# Patient Record
Sex: Female | Born: 1990 | Race: Black or African American | Hispanic: No | Marital: Single | Smoking: Never smoker
Health system: Southern US, Community
[De-identification: ages and names within clinical notes are randomized; demographics above are authoritative.]

## PROBLEM LIST (undated history)

## (undated) ENCOUNTER — Inpatient Hospital Stay (HOSPITAL_COMMUNITY): Payer: Self-pay

## (undated) DIAGNOSIS — Z789 Other specified health status: Secondary | ICD-10-CM

## (undated) DIAGNOSIS — F419 Anxiety disorder, unspecified: Secondary | ICD-10-CM

## (undated) DIAGNOSIS — F32A Depression, unspecified: Secondary | ICD-10-CM

## (undated) DIAGNOSIS — D649 Anemia, unspecified: Secondary | ICD-10-CM

## (undated) DIAGNOSIS — F329 Major depressive disorder, single episode, unspecified: Secondary | ICD-10-CM

## (undated) HISTORY — PX: THERAPEUTIC ABORTION: SHX798

## (undated) HISTORY — PX: DILATION AND CURETTAGE OF UTERUS: SHX78

---

## 2011-06-25 ENCOUNTER — Emergency Department (HOSPITAL_COMMUNITY): Payer: BC Managed Care – PPO

## 2011-06-25 ENCOUNTER — Emergency Department (HOSPITAL_COMMUNITY)
Admission: EM | Admit: 2011-06-25 | Discharge: 2011-06-25 | Disposition: A | Discharge status: Home Health | Payer: BC Managed Care – PPO | Attending: Emergency Medicine | Admitting: Emergency Medicine

## 2011-06-25 ENCOUNTER — Encounter (HOSPITAL_COMMUNITY): Payer: Self-pay | Admitting: Family Medicine

## 2011-06-25 DIAGNOSIS — N898 Other specified noninflammatory disorders of vagina: Secondary | ICD-10-CM | POA: Insufficient documentation

## 2011-06-25 DIAGNOSIS — O200 Threatened abortion: Secondary | ICD-10-CM | POA: Insufficient documentation

## 2011-06-25 LAB — CBC
Hemoglobin: 11.6 g/dL — ABNORMAL LOW (ref 12.0–15.0)
MCH: 21.4 pg — ABNORMAL LOW (ref 26.0–34.0)
MCHC: 32.6 g/dL (ref 30.0–36.0)
RDW: 15.2 % (ref 11.5–15.5)

## 2011-06-25 LAB — DIFFERENTIAL
Basophils Absolute: 0 10*3/uL (ref 0.0–0.1)
Basophils Relative: 0 % (ref 0–1)
Eosinophils Absolute: 0.3 10*3/uL (ref 0.0–0.7)
Lymphocytes Relative: 14 % (ref 12–46)
Monocytes Absolute: 0.7 10*3/uL (ref 0.1–1.0)
Neutrophils Relative %: 75 % (ref 43–77)

## 2011-06-25 LAB — HCG, QUANTITATIVE, PREGNANCY: hCG, Beta Chain, Quant, S: 50442 m[IU]/mL — ABNORMAL HIGH (ref <5)

## 2011-06-25 NOTE — Discharge Instructions (Signed)
Bleeding during the first 20 weeks of pregnancy is common. This is sometimes called a threatened miscarriage. This is a pregnancy that is threatening to end before the twentieth week of pregnancy.  Miscarriages occur in 15 to 20% of all pregnancies and usually occur during the first 13 weeks of the pregnancy. The exact cause of a miscarriage is usually never known. A miscarriage is nature’s way of ending a pregnancy that is abnormal or would not make it to term.  °HOME CARE INSTRUCTIONS °DO NOT USE TAMPONS. Do not douche, have sexual intercourse or orgasms until approved by your caregiver.  °Call for re-evaluation of your pregnancy and possible repeat blood test. Re-evaluation often occurs after 2 days.  °If you are Rh negative and the father is Rh positive or you do not know the father’s blood type, you may receive a shot (Rh immune globulin) to help prevent abnormal antibodies that can develop and affect the baby in any future pregnancies. °SEEK IMMEDIATE MEDICAL ATTENTION IF: °You have severe cramps in your stomach, back, or abdomen.  °You have a sudden onset of severe pain in the lower part of your abdomen.  °You run an unexplained temperature of 101 F (38.3 C) or higher.  °You pass large clots or tissue. Save any tissue for your caregiver to inspect.  °Your bleeding increases or you become light-headed, weak, or have fainting episodes.  °

## 2011-06-25 NOTE — ED Notes (Signed)
Patient transported to Ultrasound 

## 2011-06-25 NOTE — ED Provider Notes (Signed)
History     CSN: [id number]  Arrival date & time 06/25/11  0846   First MD Initiated Contact with Patient 06/25/11 [phone]      Chief Complaint  Patient presents with  . Vaginal Bleeding    (Consider location/radiation/quality/duration/timing/severity/associated sxs/prior treatment) HPI This 21 year old female reports she is gravida 1 para 0 with a positive pregnancy test just over 4 weeks ago at the end of January 2013. Since that time 2 weeks ago she had some light vaginal spotting for less than one day. Then last night and this morning she has some light vaginal spotting as well. She has had no heavy vaginal bleeding or passage of tissue. She has no lightheadedness or fever. She has no cough chest pain or shortness of breath. She has no abdominal pain. She does not know her blood type. She has an initial OB appointment upcoming in 3 days at [facility]. Her last menstrual period was in January. She states her estimated gestational age is 10 weeks based on her last menstrual period. History reviewed. No pertinent past medical history.  History reviewed. No pertinent past surgical history.  History reviewed. No pertinent family history.  History  Substance Use Topics  . Smoking status: Never Smoker   . Smokeless tobacco: Not on file  . Alcohol Use: No    OB History    Grav Para Term Preterm Abortions TAB SAB Ect Mult Living   1               Review of Systems  Constitutional: Negative for fever.       10 Systems reviewed and are negative for acute change except as noted in the HPI.  HENT: Negative for congestion.   Eyes: Negative for discharge and redness.  Respiratory: Negative for cough and shortness of breath.   Cardiovascular: Negative for chest pain.  Gastrointestinal: Negative for vomiting, abdominal pain and diarrhea.  Genitourinary: Positive for vaginal bleeding. Negative for dysuria, flank pain, vaginal discharge and pelvic pain.  Musculoskeletal: Negative  for back pain.  Skin: Negative for rash.  Neurological: Negative for syncope, numbness and headaches.  Psychiatric/Behavioral:       No behavior change.    Allergies  Sulfa antibiotics  Home Medications   Current Outpatient Rx  Name Route Sig Dispense Refill  . PRENATAL VITAMIN PO Oral Take 1 tablet by mouth daily.      BP 125/80  Pulse 81  Temp(Src) 98.5 F (36.9 C) (Oral)  Resp 18  SpO2 99%  Physical Exam  Nursing note and vitals reviewed. Constitutional:       Awake, alert, nontoxic appearance.  HENT:  Head: Atraumatic.  Eyes: Right eye exhibits no discharge. Left eye exhibits no discharge.  Neck: Neck supple.  Cardiovascular: Normal rate and regular rhythm.   No murmur heard. Pulmonary/Chest: Effort normal and breath sounds normal. No respiratory distress. She has no wheezes. She has no rales. She exhibits no tenderness.  Abdominal: Soft. Bowel sounds are normal. She exhibits no mass. There is no tenderness. There is no rebound and no guarding.  Genitourinary:       Chaperone is presen, cervix is closed with scant vaginal bleeding, no tissue in the vaginal vault is noted, there is no vaginal discharge, bimanual examination reveals internal os closed, no cervical motion tenderness, no adnexal tenderness or masses palpated, uterus is nontender.  Musculoskeletal: She exhibits no edema and no tenderness.       Baseline ROM, no obvious new focal  weakness.  Neurological: She is alert.       Mental status and motor strength appears baseline for patient and situation.  Skin: No rash noted.  Psychiatric: She has a normal mood and affect.    ED Course  Procedures (including critical care time) Pt stable in ED with no significant deterioration in condition.Patient / Family / Caregiver informed of clinical course, understand medical decision-making process, and agree with plan. Labs Reviewed  CBC - Abnormal; Notable for the following:    RBC 5.43 (*)    Hemoglobin 11.6 (*)     HCT 35.6 (*)    MCV 65.6 (*)    MCH 21.4 (*)    All other components within normal limits  HCG, QUANTITATIVE, PREGNANCY - Abnormal; Notable for the following:    hCG, Beta Chain, Quant, [place] [address] (*)    All other components within normal limits  POCT PREGNANCY, URINE - Abnormal; Notable for the following:    Preg Test, Ur POSITIVE (*)    All other components within normal limits  DIFFERENTIAL  ABO/RH  LAB REPORT - SCANNED   No results found.   1. Threatened miscarriage       MDM  Message left with Dr. Arlyce Dice OB Pt has appt Monday afternoon in 3 days.  I doubt any other EMC precluding discharge at this time including, but not necessarily limited to the following:ruptured ectopic.        Hurman Horn, MD 06/27/11 [phone]

## 2011-06-25 NOTE — ED Notes (Signed)
Pt reports having positive pregnancy test in January. Has appointment for Monday at [facility].  Reports first saw spotting 2/13 and again last night.

## 2011-07-08 ENCOUNTER — Inpatient Hospital Stay (HOSPITAL_COMMUNITY)
Admission: AD | Admit: 2011-07-08 | Discharge: 2011-07-08 | Disposition: A | Discharge status: Home / Self Care | Payer: BC Managed Care – PPO | Source: Ambulatory Visit | Attending: Obstetrics & Gynecology | Admitting: Obstetrics & Gynecology

## 2011-07-08 ENCOUNTER — Encounter (HOSPITAL_COMMUNITY): Payer: Self-pay | Admitting: *Deleted

## 2011-07-08 DIAGNOSIS — O021 Missed abortion: Secondary | ICD-10-CM

## 2011-07-08 DIAGNOSIS — O039 Complete or unspecified spontaneous abortion without complication: Secondary | ICD-10-CM | POA: Insufficient documentation

## 2011-07-08 HISTORY — DX: Other specified health status: Z78.9

## 2011-07-08 NOTE — MAU Provider Note (Signed)
  History     CSN: [id number]  Arrival date and time: 07/08/11 1844   First Provider Initiated Contact with Patient 07/08/11 1940      Chief Complaint  Patient presents with  . Miscarriage   HPI  Pt is post incomplete SAB and was given Cytotec on 3/11 since pt had retained POC.  Pt states that she has had increase in pain and bleeding today.    Past Medical History  Diagnosis Date  . No pertinent past medical history     Past Surgical History  Procedure Date  . No past surgeries     Family History  Problem Relation Age of Onset  . Anesthesia problems Neg Hx     History  Substance Use Topics  . Smoking status: Never Smoker   . Smokeless tobacco: Not on file  . Alcohol Use: No    Allergies:  Allergies  Allergen Reactions  . Sulfa Antibiotics Hives    Prescriptions prior to admission  Medication Sig Dispense Refill  . Prenatal Vit-Fe Fumarate-FA (PRENATAL MULTIVITAMIN) TABS Take 1 tablet by mouth every morning.        ROS Physical Exam   Blood pressure 106/71, pulse 102, temperature 100.6 F (38.1 C), temperature source Oral, resp. rate 20, height 5' 4.25" (1.632 m), weight 96 lb 12.8 oz (43.908 kg), SpO2 100.00%.  Physical Exam  MAU Course  Procedures   Dr. Aldona Bar here to see pt  Assessment and Plan    Ashlee Romero 07/08/2011, 7:51 PM

## 2011-07-08 NOTE — ED Provider Notes (Addendum)
Patient who had cytotec, etc. to produce evacuation of Missed Ab.  By history, seen again in office this past Monday and U/S showed only some clot and debris - sac was gone.  Presented tonight with some increased bleeding and cramping.  To my exam, cervix open to FT.  Bleeding minimal.  Corpus posterior.  U/S by me - nice upper segmdent but debris in lower segment.  Patient comfortable and sgtable.  Had eaten full meal at 5PM.  Discussed options with patient.  Decision made to send home and be re-evaluated  in AM in office by Dr. Arlyce Dice.    To do a D&C here would require delay till 1AM as she recently ate and is not acute.  If U/S still shows debris in lower segment in office in AM, perhaps an office procedure and resolve this.

## 2011-07-08 NOTE — MAU Note (Signed)
Vomited yesterday, denies diarrhea or fever

## 2011-07-08 NOTE — MAU Note (Signed)
Pt in c/o abdominal pain and vaginal bleeding.  Was dx with inevitable miscarriage on 06/25/11 at [facility]-  Received cytotec last Monday by  Dr Arlyce Dice in office.  Had heavy bleeding and cramping x2 days.  Bleeding returned on Thursday, with mild cramping.  This Monday had onset of sharp, stabbing pains in lower abdomen.  States yesterday passed a baseball sized clot.  Still today has passed smaller clots and has gone through 3 pads with use of toilet tissue, heaviest since 1530.

## 2011-07-08 NOTE — MAU Note (Signed)
Had miscarriage, was approx 3 months preg.Was given cytotec last wk on Mon, bled off and on . Went back on Mon because of pain, was given vicodan.  Has been passing lots of clots, pain and bleeding continue.

## 2012-08-27 IMAGING — US US OB TRANSVAGINAL
1 series · 14 of 28 positions shown · non-contrast
Comparison: None.

CLINICAL DATA: Pelvic pain and vaginal bleeding.  Positive
pregnancy test.  10-week-0-day gestational age by LMP.

OBSTETRIC <14 WK US AND TRANSVAGINAL OB US
TECHNIQUE: Both transabdominal and transvaginal ultrasound
examinations were performed for complete evaluation of the
gestation as well as the maternal uterus, adnexal regions, and
pelvic cul-de-sac.  Transvaginal technique was performed to assess
early pregnancy.

[Series 1: us ob transvaginal · 0.22mm/px · 14 of 84 slices shown]
[im 4/84]
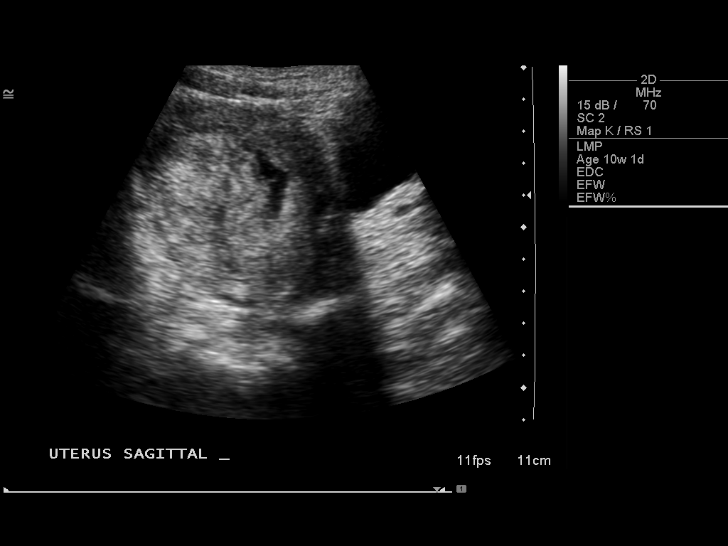
[im 10/84]
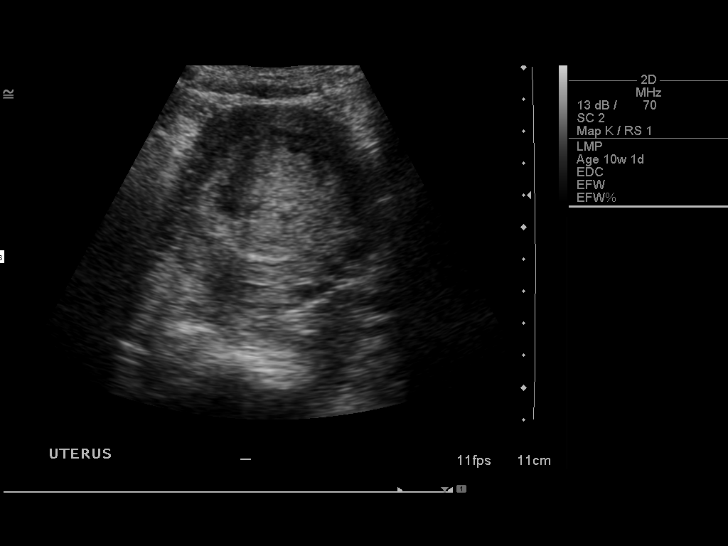
[im 16/84]
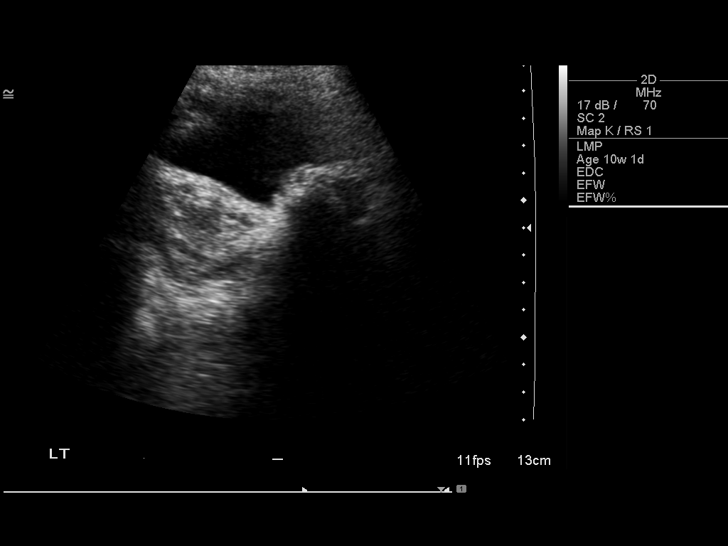
[im 22/84]
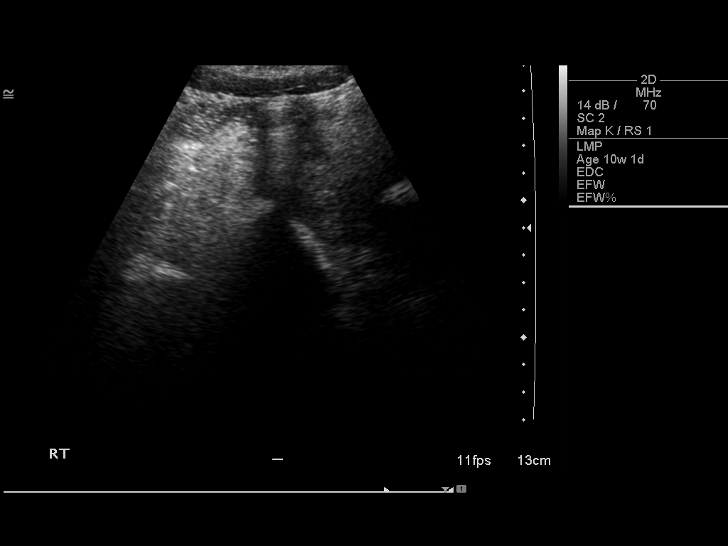
[im 28/84]
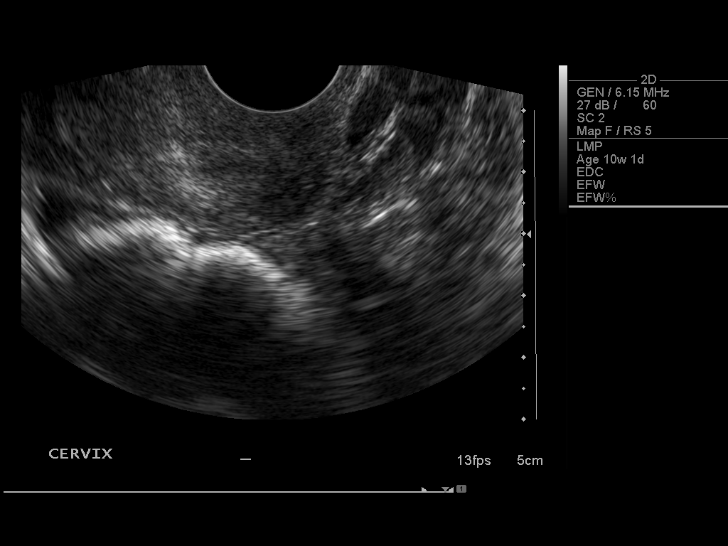
[im 34/84]
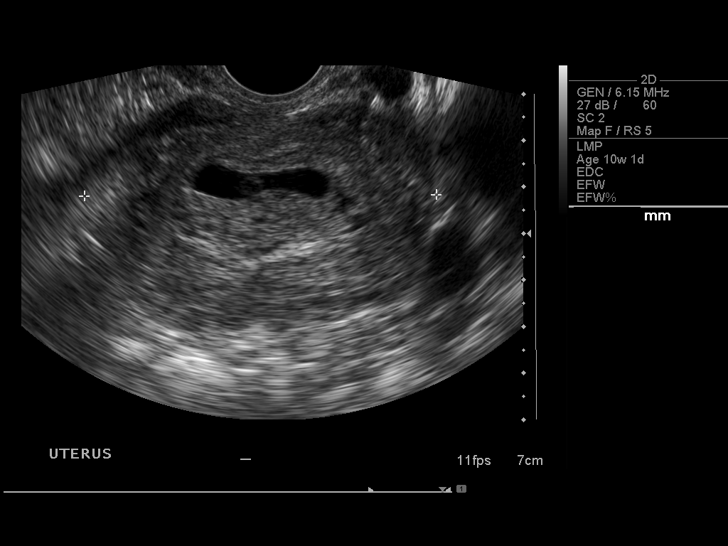
[im 40/84]
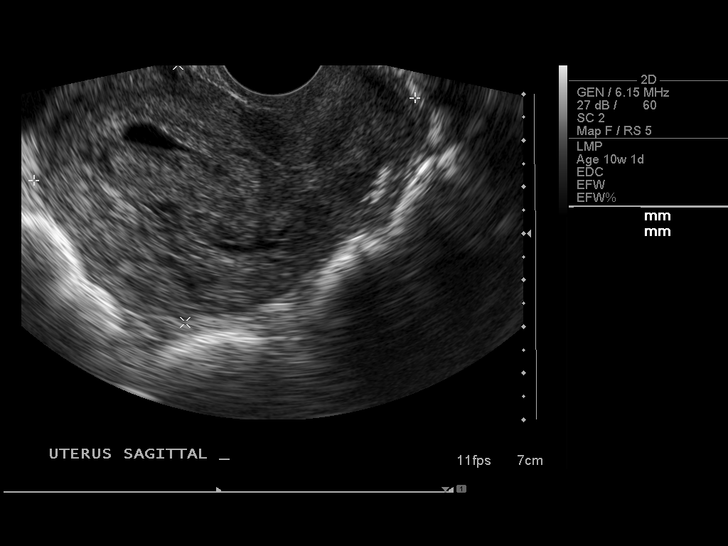
[im 47/84]
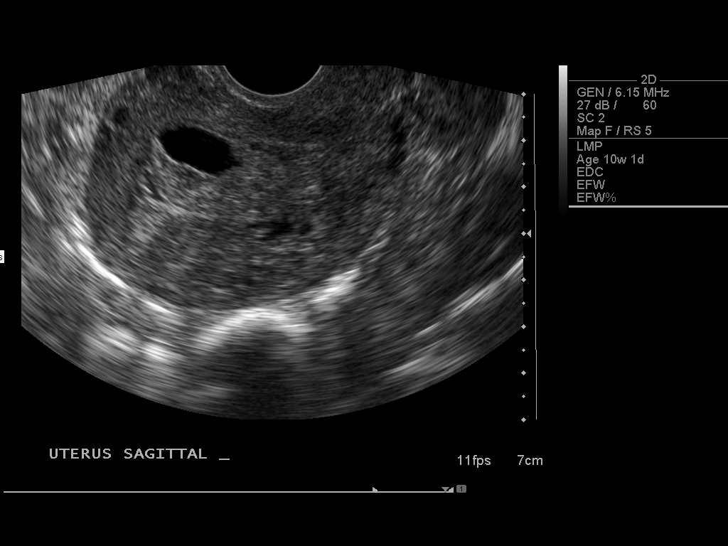
[im 53/84]
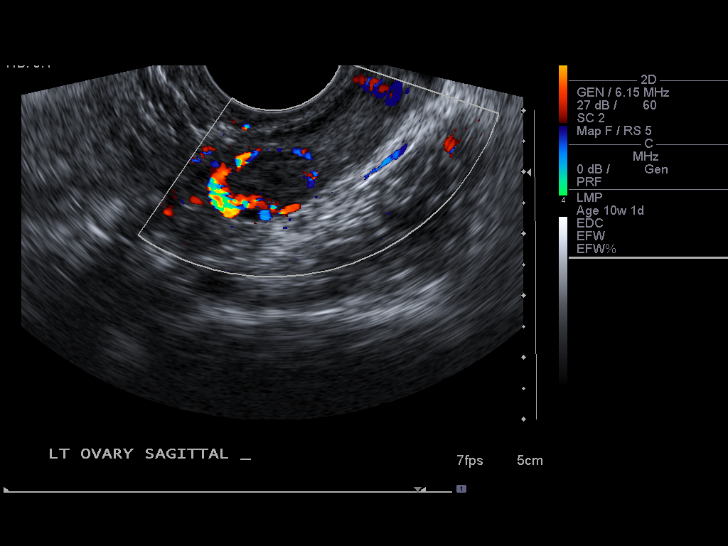
[im 59/84]
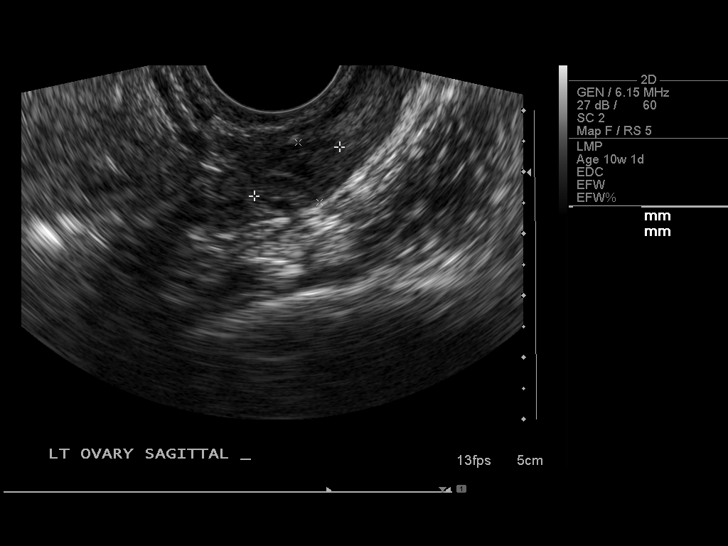
[im 65/84]
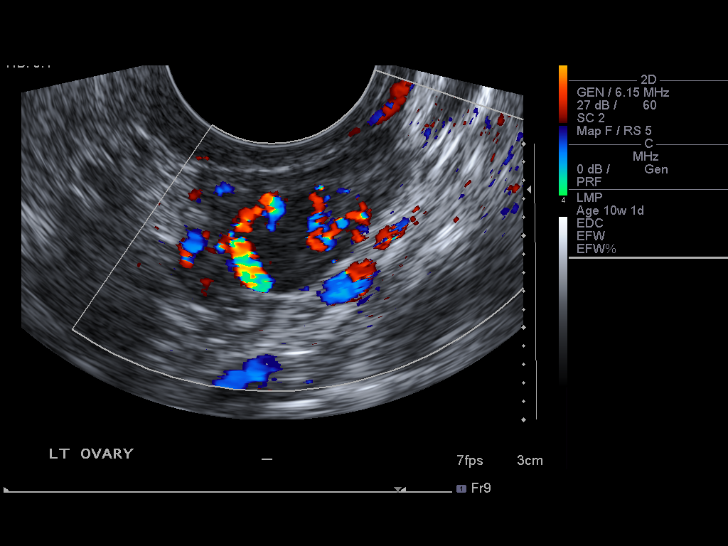
[im 71/84]
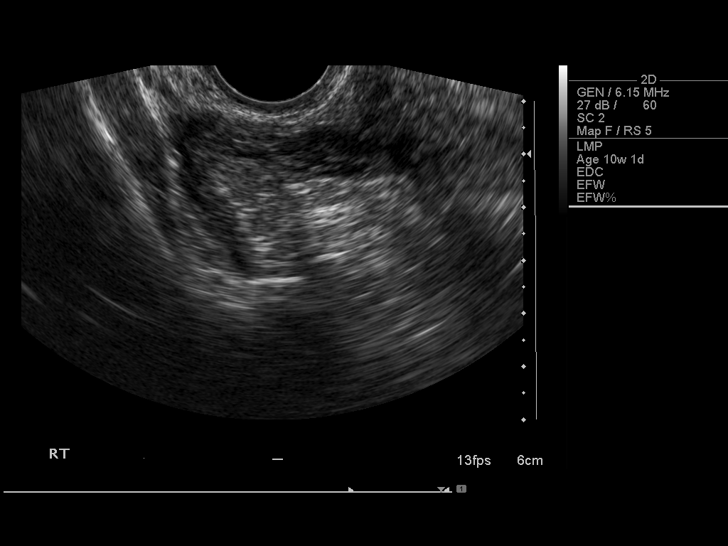
[im 77/84]
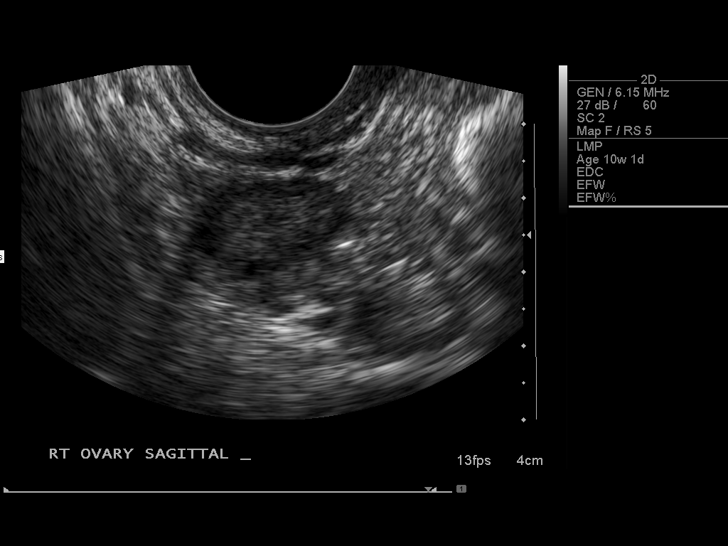
[im 84/84]
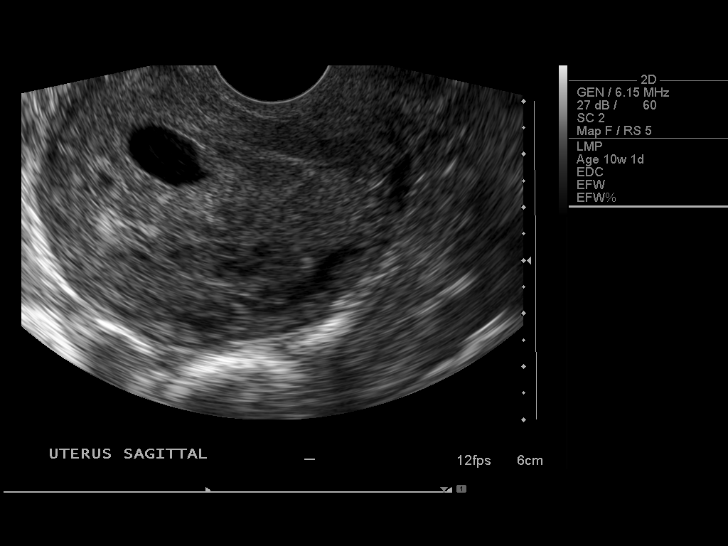

[14 of 28 positions shown; findings below may reference images not displayed]

Intrauterine gestational sac:  Single, with irregular sac shape
noted
Yolk sac: Large measuring 8 mm
Embryo: None visualized

MSD: 8  mm  6    w 5    d

Maternal uterus/adnexae:
No subchorionic hemorrhage or uterine fibroids identified.  Both
ovaries are normal appearance.  No evidence of adnexal mass or free
fluid.
IMPRESSION: Single intrauterine gestational sac measuring 6 weeks 5 days by
mean sac diameter.  Irregular sac shape, large yolk sac, and lack
of concordance with LMP are poor prognostic signs.  Recommend
continued follow-up of quantitative beta HCG levels, with follow-up
by ultrasound no earlier than 7-10 days unless there is a change in
clinical presentation.

## 2013-07-26 ENCOUNTER — Inpatient Hospital Stay (HOSPITAL_COMMUNITY): Payer: BC Managed Care – PPO

## 2013-07-26 ENCOUNTER — Inpatient Hospital Stay (HOSPITAL_COMMUNITY)
Admission: AD | Admit: 2013-07-26 | Discharge: 2013-07-27 | Disposition: A | Discharge status: Home / Self Care | Payer: BC Managed Care – PPO | Source: Ambulatory Visit | Attending: Obstetrics & Gynecology | Admitting: Obstetrics & Gynecology

## 2013-07-26 ENCOUNTER — Encounter (HOSPITAL_COMMUNITY): Payer: Self-pay | Admitting: *Deleted

## 2013-07-26 DIAGNOSIS — N939 Abnormal uterine and vaginal bleeding, unspecified: Secondary | ICD-10-CM

## 2013-07-26 DIAGNOSIS — IMO0002 Reserved for concepts with insufficient information to code with codable children: Secondary | ICD-10-CM | POA: Insufficient documentation

## 2013-07-26 DIAGNOSIS — R109 Unspecified abdominal pain: Secondary | ICD-10-CM | POA: Insufficient documentation

## 2013-07-26 HISTORY — DX: Other specified health status: Z78.9

## 2013-07-26 LAB — CBC
HCT: 32.6 % — ABNORMAL LOW (ref 36.0–46.0)
HEMOGLOBIN: 11 g/dL — AB (ref 12.0–15.0)
MCH: 21.5 pg — ABNORMAL LOW (ref 26.0–34.0)
MCHC: 33.7 g/dL (ref 30.0–36.0)
MCV: 63.7 fL — ABNORMAL LOW (ref 78.0–100.0)
Platelets: 361 10*3/uL (ref 150–400)
RBC: 5.12 MIL/uL — ABNORMAL HIGH (ref 3.87–5.11)
RDW: 17.2 % — ABNORMAL HIGH (ref 11.5–15.5)
WBC: 10.7 10*3/uL — ABNORMAL HIGH (ref 4.0–10.5)

## 2013-07-26 LAB — HCG, QUANTITATIVE, PREGNANCY: HCG, BETA CHAIN, QUANT, S: 695 m[IU]/mL — AB (ref <5)

## 2013-07-26 MED ORDER — MISOPROSTOL 200 MCG PO TABS
ORAL_TABLET | ORAL | Status: AC
  Administered 2013-07-26: 800 ug via RECTAL
  Filled 2013-07-26: qty 4

## 2013-07-26 MED ORDER — HYDROMORPHONE HCL PF 1 MG/ML IJ SOLN
0.5000 mg | Freq: Once | INTRAMUSCULAR | Status: AC
  Administered 2013-07-26: 0.5 mg via INTRAMUSCULAR
  Filled 2013-07-26: qty 1

## 2013-07-26 NOTE — MAU Note (Signed)
Had an abortion March 7.  Went for follow up today, things were fine- still showed POS preg test; nothing on US. After she got home she started bleeding heavy.  Filling pads and passing large clots now.  Feels like contractions.

## 2013-07-26 NOTE — MAU Provider Note (Signed)
History     CSN: [id number]  Arrival date and time: 07/26/13 1830   First Provider Initiated Contact with Patient 07/26/13 2103      Chief Complaint  Patient presents with  . Vaginal Bleeding  . Abdominal Pain   Vaginal Bleeding Associated symptoms include abdominal pain.  Abdominal Pain    Ashlee Romero is a 23 y.o. G2P0020 who presents today after a termination on 06/30/13. She states that she has been bleeding heavily all day. She went to the provider who preformed the surgical termination earlier today. She states that they did a urine pregnancy test that was positive, and US and they did not see anything (per the patient). However she has continued to bleed and pass large clots.   Past Medical History  Diagnosis Date  . No pertinent past medical history   . Medical history non-contributory     Past Surgical History  Procedure Laterality Date  . Dilation and curettage of uterus      Family History  Problem Relation Age of Onset  . Anesthesia problems Neg Hx     History  Substance Use Topics  . Smoking status: Never Smoker   . Smokeless tobacco: Never Used  . Alcohol Use: No    Allergies:  Allergies  Allergen Reactions  . Sulfa Antibiotics Hives    Prescriptions prior to admission  Medication Sig Dispense Refill  . Multiple Vitamin (MULTIVITAMIN WITH MINERALS) TABS tablet Take 1 tablet by mouth daily.      . norgestrel-ethinyl estradiol (LO/OVRAL,CRYSELLE) 0.3-30 MG-MCG tablet Take 1 tablet by mouth daily.        Review of Systems  Gastrointestinal: Positive for abdominal pain.  Genitourinary: Positive for vaginal bleeding.   Physical Exam   Blood pressure 108/74, pulse 83, temperature 98.1 F (36.7 C), temperature source Oral, resp. rate 16, height 5' 4.5" (1.638 m), weight 47.174 kg (104 lb), last menstrual period 04/27/2013, unknown if currently breastfeeding.  Physical Exam  Nursing note and vitals reviewed. Constitutional: She is oriented to  person, place, and time. She appears well-developed and well-nourished. No distress.  Cardiovascular: Normal rate.   Respiratory: Effort normal.  GI: Soft. There is no tenderness.  Genitourinary:   External: no lesion Vagina: large amount of blood and clots  Cervix: pink, smooth, blood pouring from the os. Unable to visualize any tissue at the os.  Uterus: NSSC Adnexa: NT   Neurological: She is alert and oriented to person, place, and time.  Skin: Skin is warm and dry.  Psychiatric: She has a normal mood and affect.    MAU Course  Procedures  Results for orders placed during the hospital encounter of 07/26/13 (from the past 24 hour(s))  CBC     Status: Abnormal   Collection Time    07/26/13  9:20 PM      Result Value Ref Range   WBC 10.7 (*) 4.0 - 10.5 K/uL   RBC 5.12 (*) 3.87 - 5.11 MIL/uL   Hemoglobin 11.0 (*) 12.0 - 15.0 g/dL   HCT 57.832.6 (*) 46.936.0 - 62.946.0 %   MCV 63.7 (*) 78.0 - 100.0 fL   MCH 21.5 (*) 26.0 - 34.0 pg   MCHC 33.7  30.0 - 36.0 g/dL   RDW 52.817.2 (*) 41.311.5 - 24.415.5 %   Platelets 361  150 - 400 K/uL  HCG, QUANTITATIVE, PREGNANCY     Status: Abnormal   Collection Time    07/26/13  9:20 PM      Result  Value Ref Range   hCG, Beta Chain, Quant, S 695 (*) <5 mIU/mL   US Ob Comp Less 14 Wks  07/26/2013   CLINICAL DATA:  Postpartum. Evaluate for retained products of conception.  EXAM: OBSTETRIC <14 WK [place] AND TRANSVAGINAL OB US  TECHNIQUE: Both transabdominal and transvaginal ultrasound examinations were performed for complete evaluation of the gestation as well as the maternal uterus, adnexal regions, and pelvic cul-de-sac. Transvaginal technique was performed to assess early pregnancy.  COMPARISON:  None.  FINDINGS: Intrauterine gestational sac: None identified.  Maternal uterus/adnexae: No free pelvic fluid. The bilateral ovaries are unremarkable in appearance. Heterogeneous material noted within the endometrial canal of the lower uterine segment and within the cervical  canal. A small amount of fluid is noted within the upper endometrial canal. No convincing ring vascularity within the endometrial contents on color and power Doppler imaging.  IMPRESSION: Heterogeneous material within the endometrial canal of the lower uterine segment and within the cervical canal. Findings are most suggestive of clot/thrombus. No definite internal vascularity to suggest retained products of conception.   Electronically Signed   By: Malachy Moan M.D.   On: 07/26/2013 22:35   US Ob Transvaginal  07/26/2013   CLINICAL DATA:  Postpartum. Evaluate for retained products of conception.  EXAM: OBSTETRIC <14 WK [place] AND TRANSVAGINAL OB US  TECHNIQUE: Both transabdominal and transvaginal ultrasound examinations were performed for complete evaluation of the gestation as well as the maternal uterus, adnexal regions, and pelvic cul-de-sac. Transvaginal technique was performed to assess early pregnancy.  COMPARISON:  None.  FINDINGS: Intrauterine gestational sac: None identified.  Maternal uterus/adnexae: No free pelvic fluid. The bilateral ovaries are unremarkable in appearance. Heterogeneous material noted within the endometrial canal of the lower uterine segment and within the cervical canal. A small amount of fluid is noted within the upper endometrial canal. No convincing ring vascularity within the endometrial contents on color and power Doppler imaging.  IMPRESSION: Heterogeneous material within the endometrial canal of the lower uterine segment and within the cervical canal. Findings are most suggestive of clot/thrombus. No definite internal vascularity to suggest retained products of conception.   Electronically Signed   By: Malachy Moan M.D.   On: 07/26/2013 22:35   0036: patient is having minimal bleeding after cytotec   Assessment and Plan   1. Termination of pregnancy   2. Vaginal bleeding    Bleeding precautions Return to MAU as needed   Tawnya Crook 07/26/2013, 9:14  PM

## 2013-07-27 DIAGNOSIS — R109 Unspecified abdominal pain: Secondary | ICD-10-CM

## 2013-07-27 DIAGNOSIS — IMO0002 Reserved for concepts with insufficient information to code with codable children: Secondary | ICD-10-CM

## 2013-07-27 NOTE — MAU Provider Note (Signed)
Attestation of Attending Supervision of Advanced Practitioner (CNM/NP): Evaluation and management procedures were performed by the Advanced Practitioner under my supervision and collaboration.  I have reviewed the Advanced Practitioner's note and chart, and I agree with the management and plan.  HARRAWAY-SMITH, Briante Loveall 1:30 AM

## 2013-07-27 NOTE — Discharge Instructions (Signed)
°  HOME CARE INSTRUCTIONS   Your caregiver may order bed rest or may allow you to continue light activity. Resume activity as directed by your caregiver.  Have someone help with home and family responsibilities during this time.   Keep track of the number of sanitary pads you use each day and how soaked (saturated) they are. Write down this information.   Do not use tampons. Do not douche or have sexual intercourse until approved by your caregiver.   Only take over-the-counter or prescription medicines for pain or discomfort as directed by your caregiver.   Do not take aspirin. Aspirin can cause bleeding.   Keep all follow-up appointments with your caregiver.  Marland Kitchen.  SEEK IMMEDIATE MEDICAL CARE IF:   You have severe cramps or pain in your back or abdomen.  You have a fever.  You pass large blood clots (walnut-sized or larger) ortissue from your vagina. Save any tissue for your caregiver to inspect.   Your bleeding increases.   You have a thick, bad-smelling vaginal discharge.  You become lightheaded, weak, or you faint.   You have chills.  MAKE SURE YOU:  Understand these instructions.  Will watch your condition.  Will get help right away if you are not doing well or get worse. Document Released: 10/06/2000 Document Revised: 08/07/2012 Document Reviewed: 06/01/2011 [facility] Patient Information 2014 [place], [place].

## 2014-02-25 ENCOUNTER — Encounter (HOSPITAL_COMMUNITY): Payer: Self-pay | Admitting: *Deleted

## 2015-09-04 ENCOUNTER — Other Ambulatory Visit: Payer: Self-pay | Admitting: Obstetrics & Gynecology

## 2015-09-08 ENCOUNTER — Encounter (HOSPITAL_COMMUNITY): Payer: Self-pay

## 2015-09-09 ENCOUNTER — Encounter (HOSPITAL_COMMUNITY): Payer: Self-pay | Admitting: Anesthesiology

## 2015-09-09 ENCOUNTER — Ambulatory Visit (HOSPITAL_COMMUNITY): Payer: BC Managed Care – PPO | Admitting: Anesthesiology

## 2015-09-09 ENCOUNTER — Ambulatory Visit (HOSPITAL_COMMUNITY)
Admission: RE | Admit: 2015-09-09 | Discharge: 2015-09-09 | Disposition: A | Discharge status: Home / Self Care | Payer: BC Managed Care – PPO | Source: Ambulatory Visit | Attending: Obstetrics & Gynecology | Admitting: Obstetrics & Gynecology

## 2015-09-09 ENCOUNTER — Encounter (HOSPITAL_COMMUNITY)
Admission: RE | Disposition: A | Discharge status: Home / Self Care | Payer: Self-pay | Source: Ambulatory Visit | Attending: Obstetrics & Gynecology

## 2015-09-09 DIAGNOSIS — F418 Other specified anxiety disorders: Secondary | ICD-10-CM | POA: Diagnosis not present

## 2015-09-09 DIAGNOSIS — N83201 Unspecified ovarian cyst, right side: Secondary | ICD-10-CM | POA: Diagnosis not present

## 2015-09-09 DIAGNOSIS — N839 Noninflammatory disorder of ovary, fallopian tube and broad ligament, unspecified: Secondary | ICD-10-CM | POA: Diagnosis present

## 2015-09-09 HISTORY — DX: Depression, unspecified: F32.A

## 2015-09-09 HISTORY — DX: Anxiety disorder, unspecified: F41.9

## 2015-09-09 HISTORY — DX: Major depressive disorder, single episode, unspecified: F32.9

## 2015-09-09 HISTORY — PX: LAPAROSCOPIC OVARIAN CYSTECTOMY: SHX6248

## 2015-09-09 HISTORY — DX: Anemia, unspecified: D64.9

## 2015-09-09 LAB — CBC
HCT: 38.5 % (ref 36.0–46.0)
Hemoglobin: 12.5 g/dL (ref 12.0–15.0)
MCH: 21.4 pg — AB (ref 26.0–34.0)
MCHC: 32.5 g/dL (ref 30.0–36.0)
MCV: 66 fL — AB (ref 78.0–100.0)
PLATELETS: 336 10*3/uL (ref 150–400)
RBC: 5.83 MIL/uL — AB (ref 3.87–5.11)
RDW: 15.9 % — AB (ref 11.5–15.5)
WBC: 7.8 10*3/uL (ref 4.0–10.5)

## 2015-09-09 LAB — TYPE AND SCREEN
ABO/RH(D): B POS
ANTIBODY SCREEN: NEGATIVE

## 2015-09-09 LAB — ABO/RH: ABO/RH(D): B POS

## 2015-09-09 LAB — HCG, SERUM, QUALITATIVE: PREG SERUM: NEGATIVE

## 2015-09-09 SURGERY — EXCISION, CYST, OVARY, LAPAROSCOPIC
Anesthesia: General | Site: Abdomen | Laterality: Right

## 2015-09-09 MED ORDER — PROPOFOL 10 MG/ML IV BOLUS
INTRAVENOUS | Status: DC | PRN
  Administered 2015-09-09: 140 mg via INTRAVENOUS

## 2015-09-09 MED ORDER — ROCURONIUM BROMIDE 100 MG/10ML IV SOLN
INTRAVENOUS | Status: AC
  Filled 2015-09-09: qty 1

## 2015-09-09 MED ORDER — ONDANSETRON HCL 4 MG/2ML IJ SOLN
INTRAMUSCULAR | Status: AC
  Filled 2015-09-09: qty 2

## 2015-09-09 MED ORDER — KETOROLAC TROMETHAMINE 30 MG/ML IJ SOLN
INTRAMUSCULAR | Status: AC
  Filled 2015-09-09: qty 1

## 2015-09-09 MED ORDER — ROCURONIUM BROMIDE 100 MG/10ML IV SOLN
INTRAVENOUS | Status: DC | PRN
  Administered 2015-09-09: 5 mg via INTRAVENOUS
  Administered 2015-09-09: 30 mg via INTRAVENOUS

## 2015-09-09 MED ORDER — MIDAZOLAM HCL 2 MG/2ML IJ SOLN
INTRAMUSCULAR | Status: AC
  Filled 2015-09-09: qty 2

## 2015-09-09 MED ORDER — HEPARIN SODIUM (PORCINE) 5000 UNIT/ML IJ SOLN
INTRAMUSCULAR | Status: AC
  Filled 2015-09-09: qty 1

## 2015-09-09 MED ORDER — MEPERIDINE HCL 25 MG/ML IJ SOLN: 6.2500 mg | INTRAMUSCULAR | Status: DC | PRN

## 2015-09-09 MED ORDER — DEXAMETHASONE SODIUM PHOSPHATE 10 MG/ML IJ SOLN
INTRAMUSCULAR | Status: AC
  Filled 2015-09-09: qty 1

## 2015-09-09 MED ORDER — BUPIVACAINE HCL (PF) 0.25 % IJ SOLN
INTRAMUSCULAR | Status: AC
  Filled 2015-09-09: qty 30

## 2015-09-09 MED ORDER — LACTATED RINGERS IV SOLN: INTRAVENOUS | Status: DC

## 2015-09-09 MED ORDER — DEXAMETHASONE SODIUM PHOSPHATE 4 MG/ML IJ SOLN
INTRAMUSCULAR | Status: AC
  Filled 2015-09-09: qty 1

## 2015-09-09 MED ORDER — LIDOCAINE HCL (CARDIAC) 20 MG/ML IV SOLN
INTRAVENOUS | Status: AC
  Filled 2015-09-09: qty 5

## 2015-09-09 MED ORDER — MIDAZOLAM HCL 2 MG/2ML IJ SOLN
INTRAMUSCULAR | Status: DC | PRN
  Administered 2015-09-09: 2 mg via INTRAVENOUS

## 2015-09-09 MED ORDER — GLYCOPYRROLATE 0.2 MG/ML IJ SOLN
INTRAMUSCULAR | Status: DC | PRN
  Administered 2015-09-09: 0.4 mg via INTRAVENOUS

## 2015-09-09 MED ORDER — LACTATED RINGERS IV SOLN
INTRAVENOUS | Status: DC
  Administered 2015-09-09 (×2): via INTRAVENOUS

## 2015-09-09 MED ORDER — METOCLOPRAMIDE HCL 5 MG/ML IJ SOLN: 10.0000 mg | Freq: Once | INTRAMUSCULAR | Status: DC | PRN

## 2015-09-09 MED ORDER — BUPIVACAINE HCL 0.25 % IJ SOLN
INTRAMUSCULAR | Status: DC | PRN
  Administered 2015-09-09: 10 mL

## 2015-09-09 MED ORDER — FENTANYL CITRATE (PF) 250 MCG/5ML IJ SOLN
INTRAMUSCULAR | Status: AC
  Filled 2015-09-09: qty 5

## 2015-09-09 MED ORDER — NEOSTIGMINE METHYLSULFATE 10 MG/10ML IV SOLN
INTRAVENOUS | Status: AC
  Filled 2015-09-09: qty 1

## 2015-09-09 MED ORDER — DEXAMETHASONE SODIUM PHOSPHATE 10 MG/ML IJ SOLN
INTRAMUSCULAR | Status: DC | PRN
  Administered 2015-09-09: 4 mg via INTRAVENOUS

## 2015-09-09 MED ORDER — SCOPOLAMINE 1 MG/3DAYS TD PT72
MEDICATED_PATCH | TRANSDERMAL | Status: AC
  Administered 2015-09-09: 1.5 mg via TRANSDERMAL
  Filled 2015-09-09: qty 1

## 2015-09-09 MED ORDER — GLYCOPYRROLATE 0.2 MG/ML IJ SOLN
INTRAMUSCULAR | Status: AC
  Filled 2015-09-09: qty 2

## 2015-09-09 MED ORDER — PROPOFOL 10 MG/ML IV BOLUS
INTRAVENOUS | Status: AC
  Filled 2015-09-09: qty 20

## 2015-09-09 MED ORDER — SCOPOLAMINE 1 MG/3DAYS TD PT72
1.0000 | MEDICATED_PATCH | Freq: Once | TRANSDERMAL | Status: DC
  Administered 2015-09-09: 1.5 mg via TRANSDERMAL

## 2015-09-09 MED ORDER — ONDANSETRON HCL 4 MG/2ML IJ SOLN
INTRAMUSCULAR | Status: DC | PRN
  Administered 2015-09-09: 4 mg via INTRAVENOUS

## 2015-09-09 MED ORDER — KETOROLAC TROMETHAMINE 30 MG/ML IJ SOLN
INTRAMUSCULAR | Status: DC | PRN
  Administered 2015-09-09: 30 mg via INTRAVENOUS

## 2015-09-09 MED ORDER — NEOSTIGMINE METHYLSULFATE 10 MG/10ML IV SOLN
INTRAVENOUS | Status: DC | PRN
  Administered 2015-09-09: 3 mg via INTRAVENOUS

## 2015-09-09 MED ORDER — FENTANYL CITRATE (PF) 100 MCG/2ML IJ SOLN
25.0000 ug | INTRAMUSCULAR | Status: DC | PRN
Start: 2015-09-09 — End: 2015-09-09

## 2015-09-09 MED ORDER — IBUPROFEN 600 MG PO TABS: 600.0000 mg | ORAL_TABLET | Freq: Four times a day (QID) | ORAL | Status: DC | PRN

## 2015-09-09 MED ORDER — LIDOCAINE HCL (CARDIAC) 20 MG/ML IV SOLN
INTRAVENOUS | Status: DC | PRN
  Administered 2015-09-09: 50 mg via INTRAVENOUS

## 2015-09-09 MED ORDER — FENTANYL CITRATE (PF) 100 MCG/2ML IJ SOLN
INTRAMUSCULAR | Status: DC | PRN
  Administered 2015-09-09: 100 ug via INTRAVENOUS
  Administered 2015-09-09 (×2): 25 ug via INTRAVENOUS
  Administered 2015-09-09: 50 ug via INTRAVENOUS

## 2015-09-09 MED ORDER — OXYCODONE-ACETAMINOPHEN 5-325 MG PO TABS: 1.0000 | ORAL_TABLET | Freq: Four times a day (QID) | ORAL | Status: DC | PRN

## 2015-09-09 SURGICAL SUPPLY — 33 items
BENZOIN TINCTURE PRP APPL 2/3 (GAUZE/BANDAGES/DRESSINGS) ×4 IMPLANT
CABLE HIGH FREQUENCY MONO STRZ (ELECTRODE) ×4 IMPLANT
CATH ROBINSON RED A/P 16FR (CATHETERS) IMPLANT
CLOTH BEACON ORANGE TIMEOUT ST (SAFETY) ×4 IMPLANT
DRSG COVADERM PLUS 2X2 (GAUZE/BANDAGES/DRESSINGS) ×8 IMPLANT
DRSG OPSITE POSTOP 3X4 (GAUZE/BANDAGES/DRESSINGS) ×4 IMPLANT
DURAPREP 26ML APPLICATOR (WOUND CARE) ×4 IMPLANT
GLOVE BIO SURGEON STRL SZ 6.5 (GLOVE) ×3 IMPLANT
GLOVE BIO SURGEONS STRL SZ 6.5 (GLOVE) ×1
GLOVE BIOGEL PI IND STRL 7.0 (GLOVE) ×6 IMPLANT
GLOVE BIOGEL PI INDICATOR 7.0 (GLOVE) ×6
GOWN STRL REUS W/TWL LRG LVL3 (GOWN DISPOSABLE) ×8 IMPLANT
LIGASURE 5MM LAPAROSCOPIC (INSTRUMENTS) ×4 IMPLANT
LIGASURE BLUNT 5MM 37CM (INSTRUMENTS) IMPLANT
LIQUID BAND (GAUZE/BANDAGES/DRESSINGS) ×4 IMPLANT
NEEDLE INSUFFLATION 120MM (ENDOMECHANICALS) IMPLANT
NS IRRIG 1000ML POUR BTL (IV SOLUTION) ×4 IMPLANT
PACK LAPAROSCOPY BASIN (CUSTOM PROCEDURE TRAY) ×4 IMPLANT
PAD TRENDELENBURG POSITION (MISCELLANEOUS) ×4 IMPLANT
POUCH SPECIMEN RETRIEVAL 10MM (ENDOMECHANICALS) IMPLANT
SET IRRIG TUBING LAPAROSCOPIC (IRRIGATION / IRRIGATOR) ×4 IMPLANT
SLEEVE XCEL OPT CAN 5 100 (ENDOMECHANICALS) ×4 IMPLANT
SOLUTION ELECTROLUBE (MISCELLANEOUS) IMPLANT
SUT MNCRL AB 4-0 PS2 18 (SUTURE) ×4 IMPLANT
SUT MON AB 4-0 PS1 27 (SUTURE) ×4 IMPLANT
SUT VICRYL 0 UR6 27IN ABS (SUTURE) ×4 IMPLANT
SYR 5ML LL (SYRINGE) IMPLANT
TOWEL OR 17X24 6PK STRL BLUE (TOWEL DISPOSABLE) ×8 IMPLANT
TRAY FOLEY CATH SILVER 14FR (SET/KITS/TRAYS/PACK) IMPLANT
TROCAR XCEL NON-BLD 11X100MML (ENDOMECHANICALS) IMPLANT
TROCAR XCEL NON-BLD 5MMX100MML (ENDOMECHANICALS) ×4 IMPLANT
WARMER LAPAROSCOPE (MISCELLANEOUS) ×4 IMPLANT
WATER STERILE IRR 1000ML POUR (IV SOLUTION) ×4 IMPLANT

## 2015-09-09 NOTE — Discharge Instructions (Signed)
DISCHARGE INSTRUCTIONS: Laparoscopy  The following instructions have been prepared to help you care for yourself upon your return home today.  Wound care:  Do not get the incision wet for the first 24 hours. The incision should be kept clean and dry.  The Band-Aids or dressings may be removed the day after surgery.  Should the incision become sore, red, and swollen after the first week, check with your doctor.  Personal hygiene:  Shower the day after your procedure.  Activity and limitations:  Do NOT drive or operate any equipment today.  Do NOT lift anything more than 20 pounds for 2 weeks after surgery.  Do NOT rest in bed all day.  Walking is encouraged. Walk each day, starting slowly with 5-minute walks 3 or 4 times a day. Slowly increase the length of your walks.  Walk up and down stairs slowly.  Do NOT do strenuous activities, such as golfing, playing tennis, bowling, running, biking, weight lifting, gardening, mowing, or vacuuming for 2-4 weeks. Ask your doctor when it is okay to start.  Diet: Eat a light meal as desired this evening. You may resume your usual diet tomorrow.  Return to work: This is dependent on the type of work you do. For the most part you can return to a desk job within a week of surgery. If you are more active at work, please discuss this with your doctor.  What to expect after your surgery: You may have a slight burning sensation when you urinate on the first day. You may have a very small amount of blood in the urine. Expect to have a small amount of vaginal discharge/light bleeding for 1-2 weeks. It is not unusual to have abdominal soreness and bruising for up to 2 weeks. You may be tired and need more rest for about 1 week. You may experience shoulder pain for 24-72 hours. Lying flat in bed may relieve it.  Call your doctor for any of the following:  Develop a fever of 100.4 or greater  Inability to urinate 6 hours after discharge from  hospital  Severe pain not relieved by pain medications  Persistent of heavy bleeding at incision site  Redness or swelling around incision site after a week  Increasing nausea or vomiting  May remove scop patch behind ear by 09/12/15. Wash hands with soap and water after handling patch.  May take Ibuprofen after 8:45pm 09/09/15.   Patient Signature________________________________________  Nurse Signature_________________________________________

## 2015-09-09 NOTE — Anesthesia Preprocedure Evaluation (Signed)
Anesthesia Evaluation  Patient identified by MRN, date of birth, ID band Patient awake    Reviewed: Allergy & Precautions, NPO status , Patient's Chart, lab work & pertinent test results  Airway Mallampati: II  TM Distance: >3 FB Neck ROM: Full    Dental no notable dental hx.    Pulmonary neg pulmonary ROS,    Pulmonary exam normal breath sounds clear to auscultation       Cardiovascular negative cardio ROS Normal cardiovascular exam Rhythm:Regular Rate:Normal     Neuro/Psych negative neurological ROS  negative psych ROS   GI/Hepatic negative GI ROS, Neg liver ROS,   Endo/Other  negative endocrine ROS  Renal/GU negative Renal ROS  negative genitourinary   Musculoskeletal negative musculoskeletal ROS (+)   Abdominal   Peds negative pediatric ROS (+)  Hematology negative hematology ROS (+)   Anesthesia Other Findings   Reproductive/Obstetrics negative OB ROS                             Anesthesia Physical Anesthesia Plan  ASA: I  Anesthesia Plan: General   Post-op Pain Management:    Induction: Intravenous  Airway Management Planned: Oral ETT  Additional Equipment:   Intra-op Plan:   Post-operative Plan: Extubation in OR  Informed Consent: I have reviewed the patients History and Physical, chart, labs and discussed the procedure including the risks, benefits and alternatives for the proposed anesthesia with the patient or authorized representative who has indicated his/her understanding and acceptance.   Dental advisory given  Plan Discussed with: CRNA  Anesthesia Plan Comments:         Anesthesia Quick Evaluation  

## 2015-09-09 NOTE — H&P (Signed)
Daivd CouncilCourtney Frank is an 25 y.o. female.  G1P0010 sexually active not using protection presents for acute GYN appointment. Pt c/o sharp lower abd pain/cramping x 14 days. LMP 07/20/2015. Neg UPT on 5/4/ 2017 and 09/01/2015. Not trying to conceive. Denies vaginal discharge, odor, constipation. States that her periods are normally regular/monthly. States over the weekend she had a lot of food aversions- mainly with smells" UPT in office today neg. Patient notes that at its worse the pain was stabbing, lower pelvis both sides, it comes and goes and last a few minutes and spontaneously resolves. She had some associated nausea with the pain but no vomiting. She denies dysuria, hematuria, abnormal bleeding, normal BM, no melena or hematochezia.   Pertinent Gynecological History: Menses: flow is light and regular every month without intermenstrual spotting Bleeding: normal Contraception: none DES exposure: denies Blood transfusions: none Sexually transmitted diseases: no past history Previous GYN Procedures: DNC  Last mammogram: not applicable  Last pap: normal Date: 06/2013 OB History: G2, P0020   Menstrual History: Menarche age: [age]  Patient's last menstrual period was 07/21/2015.    Past Medical History  Diagnosis Date  . No pertinent past medical history   . Medical history non-contributory   . Depression   . Anxiety   . Anemia     history of    Past Surgical History  Procedure Laterality Date  . Dilation and curettage of uterus    . Therapeutic abortion      Family History  Problem Relation Age of Onset  . Anesthesia problems Neg Hx     Social History:  reports that she has never smoked. She has never used smokeless tobacco. She reports that she does not drink alcohol or use illicit drugs.  Allergies:  Allergies  Allergen Reactions  . Sulfa Antibiotics Hives    Prescriptions prior to admission  Medication Sig Dispense Refill Last Dose  . metroNIDAZOLE (FLAGYL) 500 MG tablet  Take 500 mg by mouth 2 (two) times daily.     . Multiple Vitamin (MULTIVITAMIN WITH MINERALS) TABS tablet Take 1 tablet by mouth daily.   Past Week at Unknown time    Review of Systems  Constitutional: Negative.  Negative for fever and chills.  HENT: Negative for hearing loss.   Eyes: Negative.  Negative for blurred vision and double vision.  Respiratory: Negative for cough.   Cardiovascular: Negative for chest pain and palpitations.  Gastrointestinal: Positive for abdominal pain.  Genitourinary: Negative for dysuria and urgency.  Skin: Negative for itching and rash.  Neurological: Negative for headaches.  Endo/Heme/Allergies: Negative for environmental allergies. Does not bruise/bleed easily.  All other systems reviewed and are negative.   Last menstrual period 07/21/2015, unknown if currently breastfeeding. Physical Exam  Vitals reviewed. Constitutional: She is oriented to person, place, and time. She appears well-developed and well-nourished.  HENT:  Head: Normocephalic and atraumatic.  Eyes: Pupils are equal, round, and reactive to light.  Neck: Normal range of motion.  Cardiovascular: Normal rate and regular rhythm.   Respiratory: Effort normal and breath sounds normal. No respiratory distress. She has no wheezes. She has no rales.  GI: Soft. There is tenderness. There is no rebound and no guarding.  Genitourinary:  Vulva: no masses,  no atrophy,  no lesions  Bladder/Urethra: normal meatus,  no urethral discharge,  no urethral mass,  bladder non distended  Vagina no tenderness,  no erythema,  no abnormal vaginal discharge,  no vesicle(s) or ulcers,  no cystocele,  no rectocele  Cervix: grossly normal,  no discharge,  no cervical motion tenderness  Uterus: normal size,  normal shape,  midline,  mobile,  non-tender,  no uterine prolapse  Adnexa/Parametria: no parametrial tenderness,  no parametrial mass,  no ovarian mass,  adnexal tenderness  (mild tenderness - right adnexa)     Musculoskeletal: She exhibits no edema.  Neurological: She is alert and oriented to person, place, and time.  Psychiatric: She has a normal mood and affect. Her behavior is normal.    No results found for this or any previous visit (from the past 24 hour(s)).  No results found.  Assessment/Plan: 25 yo P0 with 3.8 cm solid right ovarian mass. Concern for intermittent torsion by history.  Patient symptomatic therefore patient counseled that the cyst should be removed.  She was counseled that d/t her age it may be a dermoid, however malignancy cannot be ruled out.  The patient was counseled that we should proceed with removal of the cyst in the operating room.  She was counseled on the risks, benefits, indications and alternatives of a right ovarian cystectomy. Patient understands that the right ovary may be damaged in trying to remove the cyst therefore there is a remote possibility of a R salpingo-oophorectomy, she understands that the goal is ovarian conservation. Patient also counseled that if there is evidence of an injury there may be a need to convert to an open laparotomy procedure. The patient understands that if pathology shows malignancy she may need further staging with Gyn Oncology. Risks of surgery were explained to include, but are not limited to that of infection or blood loss, injury to bowel, bladder, ureters, nerves arteries or veins. She understands the risks and benefits and desires definitive surgical management of her R adnexal mass. UPT in office negative so concern for ectopic low.   1. Patient on call to OR for Lapaorscopic right ovarian cystectomy possible salpingoopherectomy  2. Consent signed and on the chart 3. Pre-op labs on day of surgery including BHCG  Elivia Robotham STACIA 09/09/2015, 11:32 AM

## 2015-09-09 NOTE — Anesthesia Procedure Notes (Signed)
Procedure Name: Intubation Date/Time: 09/09/2015 1:37 PM Performed by: Yolonda KidaARVER, Blenda Wisecup L Pre-anesthesia Checklist: Patient identified, Patient being monitored, Emergency Drugs available and Suction available Patient Re-evaluated:Patient Re-evaluated prior to inductionOxygen Delivery Method: Circle system utilized Preoxygenation: Pre-oxygenation with 100% oxygen Intubation Type: IV induction Ventilation: Mask ventilation without difficulty Laryngoscope Size: Mac and 3 Grade View: Grade I Tube type: Oral Tube size: 7.0 mm Number of attempts: 1 Airway Equipment and Method: Stylet Placement Confirmation: ETT inserted through vocal cords under direct vision,  breath sounds checked- equal and bilateral,  positive ETCO2 and CO2 detector Secured at: 21 cm Tube secured with: Tape Dental Injury: Teeth and Oropharynx as per pre-operative assessment

## 2015-09-09 NOTE — Op Note (Signed)
Preop Diagnosis: RIGHT OVARIAN CYST   Postop Diagnosis: RIGHT OVARIAN CYST   Procedure:  1. Exam under anesthesia 2.  LAPAROSCOPIC OVARIAN CYSTECTOMY   Anesthesia: General   Anesthesiologist: No responsible provider has been recorded for the case.   Attending: Essie HartWalda Emric Kowalewski, MD   Assistant: Ilda Moriichard Kaplan  Findings: Examination under anesthesia demonstrates normal external vulva, normal vaginal tissue, normal appearing cervix -  G/C culture obtained Uterus: anteverted, mobile no palpable masses Adnexa: right fullness Laparoscopic findings: Normal appearing uterus; Normal appearing right and left salpinx.  Left ovary normal in appearance, however scarred to  The posterior side wall in Pouch of [place].  Right ovary, enlarged, with brown, red fluid and tissue eminating from posterior capsule.  Blood and clot in posterior cul de sac.  Cyst wall and tissue sent off to Pathology.  Scar tissue present along left ileum to abdominal side wall.    Pathology: Source of Specimen:  right cyst wall and ovarian / cyst tissue  Fluids: 1200 mL  UOP: 200 mL  EBL: 20 mL  Complications: none  Procedure: The patient was taken to the operating room after the risks, benefits, alternatives, complications, treatment options, and expected outcomes were discussed with the patient. The patient verbalized understanding, the patient concurred with the proposed plan and consent signed and witnessed. The patient was taken to the Operating Room, identified as Daivd CouncilCourtney Bloomquist and the procedure verified as laparoscopic ovarian cystectomy. A Time Out was held and the above information confirmed.  The patient was taken to the operating room after appropriate identification and placed on the operating table. After the attainment of adequate general anesthesia she was placed in the modified lithotomy position using Allen stirrups. Both upper extremities were padded and placed by her side. An examination under anesthesia  was performed.  The abdomen was prepped with ChloraPrep. The perineum and vagina were prepped with multiple layers of Betadine.  The bladder was catheterized. The abdomen and perineum were draped as a sterile field. .  An acorn uterine manipulator was attached to a single toothed tenaculum that was  fixed to the anterior lip of the cervix.  The surgeon re- gowned and gloved.    A 12 mm midline infra-umbilical incision was made after infiltration with 0.25% Marcaine. A veres needle was introduced into the abdomen while tenting it up. Water drop and opening pressure confirmed correct placement, pneumoperitoneum was created with approximately 2.5 mL CO2 gas.  The 10 mm 0 degree operative laparoscope was introduced and a complete abdominal and pelvic survey was performed with findings noted above.  Patient was placed in trendelenburg and marcaine injected in the RLQ and a 5 mm incision was made and 5 mm trocar advanced into the intraabdominal cavity.  The same was done in the LLQ area. This was performed under direct video visualization. There was no noted injury with placement of any trochars.   The right ovarian cyst was incised on the antimesenteric side and a combination of blunt and sharp dissection using the monopolar cautery  to dissect the overlying ovarian cortex off of the underlying cyst. The cyst was entered almost immediately because of the very thin cyst wall, with the egress of brown fluid, clot and blood.  The dissection was continued meticulously until the entire cyst could be dissected off of the ovarian cortex and placed in the pelvis for subsequent retrieval. 5 mm Ligasure cautery was used assure hemostasis in the ovarian cortex. Copious irrigation was carried out and all operative areas  noted to be hemostatic.  The larger specimen was retrieved through the umbilicus.   All trochars were then removed from the peritoneal cavity under direct visualization as the CO2 was allowed to escape. The  supra-umbilical incision was closed with the fascial sutures in a figure-of-eight fashion. All skin incisions were closed with subcuticular sutures of 3-0 Monocryl then covered with Dermabond.  The uterine manipulator was removed as well as the Foley catheter. The patient was awakened from general anesthesia and taken to the recovery room in satisfactory condition having tolerated the procedure well with sponge and instrument counts correct. It was anticipated that she would be discharged home later that afternoon.  Omer Puccinelli STACIA

## 2015-09-09 NOTE — Transfer of Care (Signed)
Immediate Anesthesia Transfer of Care Note  Patient: Ashlee Romero  Procedure(s) Performed: Procedure(s): LAPAROSCOPIC OVARIAN CYSTECTOMY (Right)  Patient Location: PACU  Anesthesia Type:General  Level of Consciousness: awake, alert , oriented and patient cooperative  Airway & Oxygen Therapy: Patient Spontanous Breathing and Patient connected to nasal cannula oxygen  Post-op Assessment: Report given to RN and Post -op Vital signs reviewed and stable  Post vital signs: Reviewed and stable  Last Vitals:  Filed Vitals:   09/09/15 1128  BP: 110/81  Pulse: 69  Temp: 36.8 C  Resp: 20    Last Pain: There were no vitals filed for this visit.       Complications: No apparent anesthesia complications

## 2015-09-10 ENCOUNTER — Encounter (HOSPITAL_COMMUNITY): Payer: Self-pay | Admitting: Obstetrics & Gynecology

## 2015-09-10 LAB — GC/CHLAMYDIA PROBE AMP (~~LOC~~) NOT AT ARMC
Chlamydia: NEGATIVE
NEISSERIA GONORRHEA: NEGATIVE

## 2015-09-15 NOTE — Anesthesia Postprocedure Evaluation (Signed)
Anesthesia Post Note  Patient: Ashlee Romero  Procedure(s) Performed: Procedure(s) (LRB): LAPAROSCOPIC OVARIAN CYSTECTOMY (Right)  Patient location during evaluation: PACU Anesthesia Type: General Level of consciousness: awake and alert Pain management: pain level controlled Vital Signs Assessment: post-procedure vital signs reviewed and stable Respiratory status: spontaneous breathing, nonlabored ventilation, respiratory function stable and patient connected to nasal cannula oxygen Cardiovascular status: blood pressure returned to baseline and stable Postop Assessment: no signs of nausea or vomiting Anesthetic complications: no     Last Vitals:  Filed Vitals:   09/09/15 1615 09/09/15 1653  BP: 105/70 101/66  Pulse: 74 75  Temp: 36.9 C 36.4 C  Resp: 13 14    Last Pain:  Filed Vitals:   09/09/15 1711  PainSc: 2    Pain Goal: Patients Stated Pain Goal: 3 (09/09/15 1653)               Kennieth RadFitzgerald, Lauryn Lizardi E

## 2015-10-24 ENCOUNTER — Other Ambulatory Visit: Payer: Self-pay | Admitting: Obstetrics & Gynecology

## 2015-10-29 LAB — CYTOLOGY - PAP

## 2016-08-05 ENCOUNTER — Other Ambulatory Visit: Payer: Self-pay | Admitting: Obstetrics & Gynecology

## 2016-08-31 ENCOUNTER — Other Ambulatory Visit: Payer: Self-pay | Admitting: Obstetrics & Gynecology

## 2018-04-26 NOTE — L&D Delivery Note (Signed)
Delivery Note Patient pushed for approximately 1 hour after she was noted to be C/C/+1/. At 2:23 AM a viable and healthy female was delivered via Vaginal, Spontaneous (Presentation:  Occiput Anterior restituted to LOA). Shoulders and body easily delivered. Baby placed on maternal abdomen and was noted to have vigorous cry and moving all four extremities.   APGAR: 8, 9; weight pending.  Delayed cord clamping was done and cord cut by father. Spontaneous delivery of placenta, intact, 3 vessels noted. Uterine atony was alleviated by massage and IV pitocin.  There were no notable vaginal or perineal lacerations.  Patient tolerated delivery well. No complications.   Anesthesia: Epidural Episiotomy: None Lacerations:  None Suture Repair: n/a Est. Blood Loss (mL):  300  Mom to postpartum.  Baby to Couplet care / Skin to Skin.  Makenzy Krist 04/26/2019, 2:43 AM

## 2018-09-26 LAB — OB RESULTS CONSOLE ABO/RH: RH Type: POSITIVE

## 2018-09-26 LAB — OB RESULTS CONSOLE RUBELLA ANTIBODY, IGM: Rubella: IMMUNE

## 2018-09-26 LAB — OB RESULTS CONSOLE HEPATITIS B SURFACE ANTIGEN: Hepatitis B Surface Ag: NEGATIVE

## 2018-10-05 LAB — OB RESULTS CONSOLE GC/CHLAMYDIA
Chlamydia: NEGATIVE
Gonorrhea: NEGATIVE

## 2018-10-22 ENCOUNTER — Encounter (HOSPITAL_COMMUNITY): Payer: Self-pay | Admitting: *Deleted

## 2018-10-22 ENCOUNTER — Inpatient Hospital Stay (HOSPITAL_COMMUNITY)
Admission: EM | Admit: 2018-10-22 | Discharge: 2018-10-22 | Disposition: A | Discharge status: Home / Self Care | Payer: BC Managed Care – PPO | Attending: Obstetrics & Gynecology | Admitting: Obstetrics & Gynecology

## 2018-10-22 ENCOUNTER — Other Ambulatory Visit: Payer: Self-pay

## 2018-10-22 DIAGNOSIS — Z3A13 13 weeks gestation of pregnancy: Secondary | ICD-10-CM | POA: Diagnosis not present

## 2018-10-22 DIAGNOSIS — O209 Hemorrhage in early pregnancy, unspecified: Secondary | ICD-10-CM

## 2018-10-22 LAB — CBC
HCT: 38.1 % (ref 36.0–46.0)
Hemoglobin: 11.9 g/dL — ABNORMAL LOW (ref 12.0–15.0)
MCH: 21 pg — ABNORMAL LOW (ref 26.0–34.0)
MCHC: 31.2 g/dL (ref 30.0–36.0)
MCV: 67.3 fL — ABNORMAL LOW (ref 80.0–100.0)
Platelets: 259 10*3/uL (ref 150–400)
RBC: 5.66 MIL/uL — ABNORMAL HIGH (ref 3.87–5.11)
RDW: 16 % — ABNORMAL HIGH (ref 11.5–15.5)
WBC: 8.6 10*3/uL (ref 4.0–10.5)
nRBC: 0 % (ref 0.0–0.2)

## 2018-10-22 LAB — URINALYSIS, ROUTINE W REFLEX MICROSCOPIC
Glucose, UA: NEGATIVE mg/dL
Ketones, ur: NEGATIVE mg/dL
Nitrite: NEGATIVE
Protein, ur: 100 mg/dL — AB
Specific Gravity, Urine: 1.028 (ref 1.005–1.030)
pH: 6 (ref 5.0–8.0)

## 2018-10-22 LAB — WET PREP, GENITAL
Clue Cells Wet Prep HPF POC: NONE SEEN
Sperm: NONE SEEN
Trich, Wet Prep: NONE SEEN
Yeast Wet Prep HPF POC: NONE SEEN

## 2018-10-22 NOTE — MAU Provider Note (Signed)
History     CSN: [id number]  Arrival date and time: 10/22/18 1335   First Provider Initiated Contact with Patient 10/22/18 1447      Chief Complaint  Patient presents with  . Vaginal Bleeding   Ms. Ashlee Romero is a 28 y.o. G3P0020 at 751w2d who presents to MAU for vaginal bleeding which began 1300 today. Pt reports she saw a little bit of pink spotting with some bright red spots. Pt reports bleeding was not present when using restroom in MAU. Pt has been seen at [facility] starting 09/26/2018, and reports she had an early US which was normal. Last had intercourse last night. Pt reports she called [facility] and spoke with Dr. Mora ApplPinn who advised her to come to MAU for evaluation.  Passing blood clots? no Blood soaking clothes? no Lightheaded/dizzy? no Significant pelvic pain or cramping/ctx? no Passed any tissue? no  Current pregnancy problems? none Blood Type? B Positive Allergies? sulfa Current medications? PNVs Current PNC & next appt? [facility], Tuesday 10/24/2018   OB History    Gravida  3   Para      Term      Preterm      AB  2   Living  0     SAB  1   TAB  1   Ectopic      Multiple      Live Births              Past Medical History:  Diagnosis Date  . Anemia    history of  . Anxiety   . Depression   . Medical history non-contributory   . No pertinent past medical history     Past Surgical History:  Procedure Laterality Date  . DILATION AND CURETTAGE OF UTERUS    . LAPAROSCOPIC OVARIAN CYSTECTOMY Right 09/09/2015   Procedure: LAPAROSCOPIC OVARIAN CYSTECTOMY;  Surgeon: Essie HartWalda Pinn, MD;  Location: [facility];  Service: Gynecology;  Laterality: Right;  . THERAPEUTIC ABORTION      Family History  Problem Relation Age of Onset  . Anesthesia problems Neg Hx     Social History   Tobacco Use  . Smoking status: Never Smoker  . Smokeless tobacco: Never Used  Substance Use Topics  . Alcohol use: No  . Drug use: No    Allergies:  Allergies  Allergen  Reactions  . Sulfa Antibiotics Hives    Medications Prior to Admission  Medication Sig Dispense Refill Last Dose  . ibuprofen (ADVIL,MOTRIN) 600 MG tablet Take 1 tablet (600 mg total) by mouth every 6 (six) hours as needed for cramping. 60 tablet 0 More than a month at Unknown time  . metroNIDAZOLE (FLAGYL) 500 MG tablet Take 500 mg by mouth 2 (two) times daily.   More than a month at Unknown time  . Multiple Vitamin (MULTIVITAMIN WITH MINERALS) TABS tablet Take 1 tablet by mouth daily.     Marland Kitchen. oxyCODONE-acetaminophen (ROXICET) 5-325 MG tablet Take 1-2 tablets by mouth every 6 (six) hours as needed for severe pain. 30 tablet 0 More than a month at Unknown time    Review of Systems  Constitutional: Negative for chills, diaphoresis, fatigue and fever.  Respiratory: Negative for shortness of breath.   Cardiovascular: Negative for chest pain.  Gastrointestinal: Negative for abdominal pain, constipation, diarrhea, nausea and vomiting.  Genitourinary: Positive for vaginal bleeding. Negative for dysuria, flank pain, frequency, pelvic pain, urgency and vaginal discharge.  Neurological: Negative for dizziness, weakness, light-headedness and headaches.   Physical  Exam   Blood pressure 111/73, pulse (!) 105, temperature 98.8 F (37.1 C), temperature source Oral, resp. rate 18, last menstrual period 07/21/2018, SpO2 98 %, unknown if currently breastfeeding.  Patient Vitals for the past 24 hrs:  BP Temp Temp src Pulse Resp SpO2  10/22/18 1421 111/73 98.8 F (37.1 C) Oral (!) 105 18 98 %    Physical Exam  Constitutional: She is oriented to person, place, and time. She appears well-developed and well-nourished. No distress.  HENT:  Head: Normocephalic and atraumatic.  Respiratory: Effort normal.  GI: Soft. She exhibits no distension and no mass. There is no abdominal tenderness. There is no rebound and no guarding.  Genitourinary: There is no rash, tenderness or lesion on the right labia. There  is no rash, tenderness or lesion on the left labia. Uterus is enlarged. Uterus is not tender. Cervix exhibits no motion tenderness, no discharge and no friability.    No vaginal discharge, tenderness or bleeding.  No tenderness or bleeding in the vagina.    Genitourinary Comments: CE: long/closed/posterior No active bleeding noted on exam   Neurological: She is alert and oriented to person, place, and time.  Skin: Skin is warm and dry. She is not diaphoretic.  Psychiatric: She has a normal mood and affect. Her behavior is normal. Judgment and thought content normal.   Results for orders placed or performed during the hospital encounter of 10/22/18 (from the past 24 hour(s))  Urinalysis, Routine w reflex microscopic     Status: Abnormal   Collection Time: 10/22/18  2:22 PM  Result Value Ref Range   Color, Urine AMBER (A) YELLOW   APPearance CLOUDY (A) CLEAR   Specific Gravity, Urine 1.028 1.005 - 1.030   pH 6.0 5.0 - 8.0   Glucose, UA NEGATIVE NEGATIVE mg/dL   Hgb urine dipstick MODERATE (A) NEGATIVE   Bilirubin Urine SMALL (A) NEGATIVE   Ketones, ur NEGATIVE NEGATIVE mg/dL   Protein, ur 161100 (A) NEGATIVE mg/dL   Nitrite NEGATIVE NEGATIVE   Leukocytes,Ua MODERATE (A) NEGATIVE   RBC / HPF 11-20 0 - 5 RBC/hpf   WBC, UA 11-20 0 - 5 WBC/hpf   Bacteria, UA FEW (A) NONE SEEN   Squamous Epithelial / LPF 11-20 0 - 5   Mucus PRESENT    Ca Oxalate Crys, UA PRESENT    Sperm, UA PRESENT   Wet prep, genital     Status: Abnormal   Collection Time: 10/22/18  3:05 PM   Specimen: Cervical/Vaginal swab  Result Value Ref Range   Yeast Wet Prep HPF POC NONE SEEN NONE SEEN   Trich, Wet Prep NONE SEEN NONE SEEN   Clue Cells Wet Prep HPF POC NONE SEEN NONE SEEN   WBC, Wet Prep HPF POC FEW (A) NONE SEEN   Sperm NONE SEEN   CBC     Status: Abnormal   Collection Time: 10/22/18  3:42 PM  Result Value Ref Range   WBC 8.6 4.0 - 10.5 K/uL   RBC 5.66 (H) 3.87 - 5.11 MIL/uL   Hemoglobin 11.9 (L) 12.0 -  15.0 g/dL   HCT 09.638.1 04.536.0 - 40.946.0 %   MCV 67.3 (L) 80.0 - 100.0 fL   MCH 21.0 (L) 26.0 - 34.0 pg   MCHC 31.2 30.0 - 36.0 g/dL   RDW 81.116.0 (H) 91.411.5 - 78.215.5 %   Platelets 259 150 - 400 K/uL   nRBC 0.0 0.0 - 0.2 %   No results found.  MAU Course  Procedures  MDM -VB first trimester, spotting only, no pain -last intercourse last night -S=D -no bleeding on exam, no pain -UA: amber/cloudy/mod hgb/sm bilirubin/100PRO/mod leuks/few bacteria, sending urine for culture -CBC: no abnormalities requiring treatment -WetPrep: few WBCs, otherwise WNL -GC/CT collected -pt discharged to home in stable condition  Orders Placed This Encounter  Procedures  . Wet prep, genital    Standing Status:   Standing    Number of Occurrences:   1  . Culture, OB Urine    Standing Status:   Standing    Number of Occurrences:   1  . Urinalysis, Routine w reflex microscopic    Standing Status:   Standing    Number of Occurrences:   1  . CBC    Standing Status:   Standing    Number of Occurrences:   1  . Discharge patient    Order Specific Question:   Discharge disposition    Answer:   01-Home or Self Care [1]    Order Specific Question:   Discharge patient date    Answer:   10/22/2018   No orders of the defined types were placed in this encounter.  Assessment and Plan   1. Vaginal bleeding in pregnancy, first trimester   2. [redacted] weeks gestation of pregnancy    Allergies as of 10/22/2018      Reactions   Sulfa Antibiotics Hives      Medication List    STOP taking these medications   ibuprofen 600 MG tablet Commonly known as: ADVIL     TAKE these medications   metroNIDAZOLE 500 MG tablet Commonly known as: FLAGYL Take 500 mg by mouth 2 (two) times daily.   multivitamin with minerals Tabs tablet Take 1 tablet by mouth daily.   oxyCODONE-acetaminophen 5-325 MG tablet Commonly known as: Roxicet Take 1-2 tablets by mouth every 6 (six) hours as needed for severe pain.      -will call with  culture results, if positive -please keep your OB appt on 10/24/2018 -bleeding/pain/return MAU precautions given -pt discharged to home in stable condition  Elmyra Ricks E Mandee Pluta 10/22/2018, 4:30 PM

## 2018-10-22 NOTE — MAU Note (Signed)
Pt states she woke up about an hour ago and noticed some spotting when she went to the bathroom.  "It was light pink on the toilet paper with little bits of bright red in the toilet."  Denies and pain. Last intercourse was last night.

## 2018-10-22 NOTE — MAU Note (Signed)
Urine in lab Not enough for culture tube 

## 2018-10-22 NOTE — Discharge Instructions (Signed)
Vaginal Bleeding During Pregnancy, First Trimester ° °A small amount of bleeding from the vagina (spotting) is relatively common during early pregnancy. It usually stops on its own. Various things may cause bleeding or spotting during early pregnancy. Some bleeding may be related to the pregnancy, and some may not. In many cases, the bleeding is normal and is not a problem. However, bleeding can also be a sign of something serious. Be sure to tell your health care provider about any vaginal bleeding right away. °Some possible causes of vaginal bleeding during the first trimester include: °· Infection or inflammation of the cervix. °· Growths (polyps) on the cervix. °· Miscarriage or threatened miscarriage. °· Pregnancy tissue developing outside of the uterus (ectopic pregnancy). °· A mass of tissue developing in the uterus due to an egg being fertilized incorrectly (molar pregnancy). °Follow these instructions at home: °Activity °· Follow instructions from your health care provider about limiting your activity. Ask what activities are safe for you. °· If needed, make plans for someone to help with your regular activities. °· Do not have sex or orgasms until your health care provider says that this is safe. °General instructions °· Take over-the-counter and prescription medicines only as told by your health care provider. °· Pay attention to any changes in your symptoms. °· Do not use tampons or douche. °· Write down how many pads you use each day, how often you change pads, and how soaked (saturated) they are. °· If you pass any tissue from your vagina, save the tissue so you can show it to your health care provider. °· Keep all follow-up visits as told by your health care provider. This is important. °Contact a health care provider if: °· You have vaginal bleeding during any part of your pregnancy. °· You have cramps or labor pains. °· You have a fever. °Get help right away if: °· You have severe cramps in your  back or abdomen. °· You pass large clots or a large amount of tissue from your vagina. °· Your bleeding increases. °· You feel light-headed or weak, or you faint. °· You have chills. °· You are leaking fluid or have a gush of fluid from your vagina. °Summary °· A small amount of bleeding (spotting) from the vagina is relatively common during early pregnancy. °· Various things may cause bleeding or spotting in early pregnancy. °· Be sure to tell your health care provider about any vaginal bleeding right away. °This information is not intended to replace advice given to you by your health care provider. Make sure you discuss any questions you have with your health care provider. °Document Released: 01/20/2005 Document Revised: 08/01/2018 Document Reviewed: 07/15/2016 °Elsevier Patient Education © 2020 [employer] ° °

## 2018-10-24 LAB — GC/CHLAMYDIA PROBE AMP (~~LOC~~) NOT AT ARMC
Chlamydia: NEGATIVE
Neisseria Gonorrhea: NEGATIVE

## 2018-10-25 LAB — CULTURE, OB URINE

## 2019-01-31 LAB — OB RESULTS CONSOLE HIV ANTIBODY (ROUTINE TESTING): HIV: NONREACTIVE

## 2019-03-26 LAB — OB RESULTS CONSOLE GBS: GBS: NEGATIVE

## 2019-04-24 ENCOUNTER — Other Ambulatory Visit: Payer: Self-pay | Admitting: Obstetrics & Gynecology

## 2019-04-25 ENCOUNTER — Inpatient Hospital Stay (HOSPITAL_COMMUNITY): Payer: BC Managed Care – PPO | Admitting: Anesthesiology

## 2019-04-25 ENCOUNTER — Encounter (HOSPITAL_COMMUNITY): Payer: Self-pay | Admitting: Anesthesiology

## 2019-04-25 ENCOUNTER — Inpatient Hospital Stay (HOSPITAL_COMMUNITY): Payer: BC Managed Care – PPO

## 2019-04-25 ENCOUNTER — Inpatient Hospital Stay (HOSPITAL_COMMUNITY)
Admission: AD | Admit: 2019-04-25 | Discharge: 2019-04-27 | DRG: 805 | Disposition: A | Discharge status: Home / Self Care | Payer: BC Managed Care – PPO | Attending: Obstetrics & Gynecology | Admitting: Obstetrics & Gynecology

## 2019-04-25 ENCOUNTER — Other Ambulatory Visit: Payer: Self-pay

## 2019-04-25 ENCOUNTER — Encounter (HOSPITAL_COMMUNITY): Payer: Self-pay | Admitting: Obstetrics & Gynecology

## 2019-04-25 DIAGNOSIS — D649 Anemia, unspecified: Secondary | ICD-10-CM | POA: Diagnosis present

## 2019-04-25 DIAGNOSIS — U071 COVID-19: Secondary | ICD-10-CM | POA: Diagnosis present

## 2019-04-25 DIAGNOSIS — O9902 Anemia complicating childbirth: Principal | ICD-10-CM | POA: Diagnosis present

## 2019-04-25 DIAGNOSIS — O9852 Other viral diseases complicating childbirth: Secondary | ICD-10-CM | POA: Diagnosis present

## 2019-04-25 DIAGNOSIS — Z3A39 39 weeks gestation of pregnancy: Secondary | ICD-10-CM

## 2019-04-25 DIAGNOSIS — Z3490 Encounter for supervision of normal pregnancy, unspecified, unspecified trimester: Secondary | ICD-10-CM | POA: Diagnosis present

## 2019-04-25 DIAGNOSIS — O26893 Other specified pregnancy related conditions, third trimester: Secondary | ICD-10-CM | POA: Diagnosis present

## 2019-04-25 LAB — CBC
HCT: 36.6 % (ref 36.0–46.0)
Hemoglobin: 11 g/dL — ABNORMAL LOW (ref 12.0–15.0)
MCH: 20.5 pg — ABNORMAL LOW (ref 26.0–34.0)
MCHC: 30.1 g/dL (ref 30.0–36.0)
MCV: 68.3 fL — ABNORMAL LOW (ref 80.0–100.0)
Platelets: 272 10*3/uL (ref 150–400)
RBC: 5.36 MIL/uL — ABNORMAL HIGH (ref 3.87–5.11)
RDW: 16.7 % — ABNORMAL HIGH (ref 11.5–15.5)
WBC: 9.1 10*3/uL (ref 4.0–10.5)
nRBC: 0 % (ref 0.0–0.2)

## 2019-04-25 LAB — ABO/RH: ABO/RH(D): B POS

## 2019-04-25 LAB — TYPE AND SCREEN
ABO/RH(D): B POS
Antibody Screen: NEGATIVE

## 2019-04-25 LAB — SARS CORONAVIRUS 2 (TAT 6-24 HRS): SARS Coronavirus 2: POSITIVE — AB

## 2019-04-25 LAB — RPR: RPR Ser Ql: NONREACTIVE

## 2019-04-25 MED ORDER — SODIUM CHLORIDE (PF) 0.9 % IJ SOLN
INTRAMUSCULAR | Status: DC | PRN
  Administered 2019-04-25: 12 mL/h via EPIDURAL

## 2019-04-25 MED ORDER — LACTATED RINGERS IV SOLN: INTRAVENOUS | Status: DC

## 2019-04-25 MED ORDER — OXYTOCIN 40 UNITS IN NORMAL SALINE INFUSION - SIMPLE MED
1.0000 m[IU]/min | INTRAVENOUS | Status: DC
  Filled 2019-04-25: qty 1000

## 2019-04-25 MED ORDER — DIPHENHYDRAMINE HCL 50 MG/ML IJ SOLN: 12.5000 mg | INTRAMUSCULAR | Status: DC | PRN

## 2019-04-25 MED ORDER — TERBUTALINE SULFATE 1 MG/ML IJ SOLN: 0.2500 mg | Freq: Once | INTRAMUSCULAR | Status: DC | PRN

## 2019-04-25 MED ORDER — OXYTOCIN 40 UNITS IN NORMAL SALINE INFUSION - SIMPLE MED: 2.5000 [IU]/h | INTRAVENOUS | Status: DC

## 2019-04-25 MED ORDER — FLEET ENEMA 7-19 GM/118ML RE ENEM: 1.0000 | ENEMA | RECTAL | Status: DC | PRN

## 2019-04-25 MED ORDER — LIDOCAINE HCL (PF) 1 % IJ SOLN: 30.0000 mL | INTRAMUSCULAR | Status: DC | PRN

## 2019-04-25 MED ORDER — LACTATED RINGERS IV SOLN
500.0000 mL | INTRAVENOUS | Status: DC | PRN
  Administered 2019-04-25: 500 mL via INTRAVENOUS

## 2019-04-25 MED ORDER — EPHEDRINE 5 MG/ML INJ: 10.0000 mg | INTRAVENOUS | Status: DC | PRN

## 2019-04-25 MED ORDER — ACETAMINOPHEN 325 MG PO TABS: 650.0000 mg | ORAL_TABLET | ORAL | Status: DC | PRN

## 2019-04-25 MED ORDER — LACTATED RINGERS IV SOLN
500.0000 mL | Freq: Once | INTRAVENOUS | Status: AC
  Administered 2019-04-25: 500 mL via INTRAVENOUS

## 2019-04-25 MED ORDER — PHENYLEPHRINE 40 MCG/ML (10ML) SYRINGE FOR IV PUSH (FOR BLOOD PRESSURE SUPPORT): 80.0000 ug | PREFILLED_SYRINGE | INTRAVENOUS | Status: DC | PRN

## 2019-04-25 MED ORDER — FENTANYL CITRATE (PF) 100 MCG/2ML IJ SOLN: 50.0000 ug | INTRAMUSCULAR | Status: DC | PRN

## 2019-04-25 MED ORDER — LIDOCAINE-EPINEPHRINE (PF) 2 %-1:200000 IJ SOLN
INTRAMUSCULAR | Status: DC | PRN
  Administered 2019-04-25: 1 mL via EPIDURAL
  Administered 2019-04-25: 2 mL via EPIDURAL

## 2019-04-25 MED ORDER — MISOPROSTOL 25 MCG QUARTER TABLET
25.0000 ug | ORAL_TABLET | ORAL | Status: DC | PRN
  Administered 2019-04-25 (×2): 25 ug via VAGINAL
  Filled 2019-04-25 (×2): qty 1

## 2019-04-25 MED ORDER — ONDANSETRON HCL 4 MG/2ML IJ SOLN: 4.0000 mg | Freq: Four times a day (QID) | INTRAMUSCULAR | Status: DC | PRN

## 2019-04-25 MED ORDER — OXYTOCIN BOLUS FROM INFUSION
500.0000 mL | Freq: Once | INTRAVENOUS | Status: AC
  Administered 2019-04-26: 500 mL via INTRAVENOUS

## 2019-04-25 MED ORDER — OXYTOCIN 40 UNITS IN NORMAL SALINE INFUSION - SIMPLE MED
1.0000 m[IU]/min | INTRAVENOUS | Status: DC
  Administered 2019-04-25: 2 m[IU]/min via INTRAVENOUS

## 2019-04-25 MED ORDER — SOD CITRATE-CITRIC ACID 500-334 MG/5ML PO SOLN: 30.0000 mL | ORAL | Status: DC | PRN

## 2019-04-25 MED ORDER — PHENYLEPHRINE 40 MCG/ML (10ML) SYRINGE FOR IV PUSH (FOR BLOOD PRESSURE SUPPORT)
80.0000 ug | PREFILLED_SYRINGE | INTRAVENOUS | Status: DC | PRN
  Filled 2019-04-25: qty 10

## 2019-04-25 MED ORDER — FENTANYL-BUPIVACAINE-NACL 0.5-0.125-0.9 MG/250ML-% EP SOLN
12.0000 mL/h | EPIDURAL | Status: DC | PRN
  Filled 2019-04-25: qty 250

## 2019-04-25 NOTE — Anesthesia Procedure Notes (Signed)
Epidural Patient location during procedure: OB Start time: 04/25/2019 7:15 PM End time: 04/25/2019 7:30 PM  Staffing Anesthesiologist: Freddrick March, MD Performed: anesthesiologist   Preanesthetic Checklist Completed: patient identified, IV checked, risks and benefits discussed, monitors and equipment checked, pre-op evaluation and timeout performed  Epidural Patient position: sitting Prep: DuraPrep and site prepped and draped Patient monitoring: continuous pulse ox, blood pressure, heart rate and cardiac monitor Approach: midline Location: L3-L4 Injection technique: LOR air  Needle:  Needle type: Tuohy  Needle gauge: 17 G Needle length: 9 cm Needle insertion depth: 5 cm Catheter type: closed end flexible Catheter size: 19 Gauge Catheter at skin depth: 11 cm Test dose: negative  Assessment Sensory level: T8 Events: blood not aspirated, injection not painful, no injection resistance, no paresthesia and negative IV test  Additional Notes Patient identified. Risks/Benefits/Options discussed with patient including but not limited to bleeding, infection, nerve damage, paralysis, failed block, incomplete pain control, headache, blood pressure changes, nausea, vomiting, reactions to medication both or allergic, itching and postpartum back pain. Confirmed with bedside nurse the patient's most recent platelet count. Confirmed with patient that they are not currently taking any anticoagulation, have any bleeding history or any family history of bleeding disorders. Patient expressed understanding and wished to proceed. All questions were answered. Sterile technique was used throughout the entire procedure. Please see nursing notes for vital signs. Test dose was given through epidural catheter and negative prior to continuing to dose epidural or start infusion. Warning signs of high block given to the patient including shortness of breath, tingling/numbness in hands, complete motor block,  or any concerning symptoms with instructions to call for help. Patient was given instructions on fall risk and not to get out of bed. All questions and concerns addressed with instructions to call with any issues or inadequate analgesia.  Reason for block:procedure for pain

## 2019-04-25 NOTE — Anesthesia Preprocedure Evaluation (Signed)
Anesthesia Evaluation  Patient identified by MRN, date of birth, ID band Patient awake    Reviewed: Allergy & Precautions, NPO status , Patient's Chart, lab work & pertinent test results  Airway Mallampati: II  TM Distance: >3 FB Neck ROM: Full    Dental no notable dental hx.    Pulmonary neg pulmonary ROS,  COVID positive, asymptomatic   Pulmonary exam normal breath sounds clear to auscultation       Cardiovascular negative cardio ROS Normal cardiovascular exam Rhythm:Regular Rate:Normal     Neuro/Psych PSYCHIATRIC DISORDERS Anxiety Depression negative neurological ROS     GI/Hepatic negative GI ROS, Neg liver ROS,   Endo/Other  negative endocrine ROS  Renal/GU negative Renal ROS  negative genitourinary   Musculoskeletal negative musculoskeletal ROS (+)   Abdominal   Peds  Hematology negative hematology ROS (+)   Anesthesia Other Findings   Reproductive/Obstetrics (+) Pregnancy                             Anesthesia Physical Anesthesia Plan  ASA: II  Anesthesia Plan: Epidural   Post-op Pain Management:    Induction:   PONV Risk Score and Plan: Treatment may vary due to age or medical condition  Airway Management Planned: Natural Airway  Additional Equipment:   Intra-op Plan:   Post-operative Plan:   Informed Consent: I have reviewed the patients History and Physical, chart, labs and discussed the procedure including the risks, benefits and alternatives for the proposed anesthesia with the patient or authorized representative who has indicated his/her understanding and acceptance.       Plan Discussed with: Anesthesiologist  Anesthesia Plan Comments: (Patient identified. Risks, benefits, options discussed with patient including but not limited to bleeding, infection, nerve damage, paralysis, failed block, incomplete pain control, headache, blood pressure changes,  nausea, vomiting, reactions to medication, itching, and post partum back pain. Confirmed with bedside nurse the patient's most recent platelet count. Confirmed with the patient that they are not taking any anticoagulation, have any bleeding history or any family history of bleeding disorders. Patient expressed understanding and wishes to proceed. All questions were answered. )        Anesthesia Quick Evaluation

## 2019-04-25 NOTE — Progress Notes (Signed)
Ashlee Romero is a 28 y.o. G3P0020 at [redacted]w[redacted]d by  admitted for elective induction of labor  Patient seen at 10 am.  Subjective: Pt without complaints.   Objective: BP 110/73   Pulse 91   Temp 98.9 F (37.2 C) (Oral)   Resp 16   Ht 5\' 4"  (1.626 m)   Wt 69.2 kg   LMP 07/21/2018 (Exact Date)   BMI 26.19 kg/m  No intake/output data recorded. No intake/output data recorded.  FHT:  FHR: 120 bpm, variability: moderate,  accelerations:  Present,  decelerations:  Absent UC:   irregular, every 6 to 7 minutes SVE:   Dilation: 2 Effacement (%): 50 Station: -2 Exam by:: S Nix RN  Labs: Lab Results  Component Value Date   WBC 9.1 04/25/2019   HGB 11.0 (L) 04/25/2019   HCT 36.6 04/25/2019   MCV 68.3 (L) 04/25/2019   PLT 272 04/25/2019    Assessment / Plan: Elective induction of labor, management as per Dr. Alwyn Pea.   Labor: Progressing normally, currently undergoing cervical ripening with cytotec.  Preeclampsia:  None Fetal Wellbeing:  Category I Pain Control:  Epidural and IV pain meds I/D:  GBS negative Anticipated MOD:  NSVD  Alinda Dooms, MD.  04/25/2019, 4:03 PM

## 2019-04-25 NOTE — H&P (Signed)
Ashlee Romero is a 28 y.o. female G3P0020 at 39 weeks 5 days presenting for elective IOL at term Patient with irregular mild ctx, no vaginal bleeding, no leaking of fluid. Notes good fetal movement.  Prenatal course: Anemia Low lying placenta - resolved Recurrent furuncles Depression / Anxiety - no meds.   OB History    Gravida  3   Para      Term      Preterm      AB  2   Living  0     SAB  1   TAB  1   Ectopic      Multiple      Live Births             Past Medical History:  Diagnosis Date  . Anemia    history of  . Anxiety   . Depression   . Medical history non-contributory   . No pertinent past medical history    Past Surgical History:  Procedure Laterality Date  . DILATION AND CURETTAGE OF UTERUS    . LAPAROSCOPIC OVARIAN CYSTECTOMY Right 09/09/2015   Procedure: LAPAROSCOPIC OVARIAN CYSTECTOMY;  Surgeon: Sanjuana Kava, MD;  Location: [facility];  Service: Gynecology;  Laterality: Right;  . THERAPEUTIC ABORTION     Family History: family history includes Alcohol abuse in her maternal grandfather; Anxiety disorder in her maternal aunt and paternal aunt; Depression in her maternal aunt and paternal aunt; Hyperlipidemia in her mother; Hypertension in her maternal grandmother; Miscarriages / [place] in her maternal aunt and maternal grandmother. Social History:  reports that she has never smoked. She has never used smokeless tobacco. She reports that she does not drink alcohol or use drugs.     Maternal Diabetes: No Genetic Screening: Declined Maternal Ultrasounds/Referrals: Normal Fetal Ultrasounds or other Referrals:  None Maternal Substance Abuse:  No Significant Maternal Medications:  Meds include: Other: PNV, Ferrous sulfate Significant Maternal Lab Results:  Group B Strep negative Other Comments:  None  Review of Systems  Constitutional: Negative.   Eyes: Negative.   All other systems reviewed and are negative.  Maternal Medical History:   Reason for admission: Elective IOL at term  Contractions: Onset was less than 1 hour ago.   Frequency: irregular.   Perceived severity is mild.    Fetal activity: Perceived fetal activity is normal.   Last perceived fetal movement was within the past hour.    Prenatal complications: no prenatal complications Prenatal Complications - Diabetes: none.    Dilation: 1.5 Effacement (%): 50 Station: -2 Exam by:: S Nix RN Blood pressure 113/80, pulse 95, temperature 98.8 F (37.1 C), temperature source Oral, resp. rate 16, height 5\' 4"  (1.626 m), weight 69.2 kg, last menstrual period 07/21/2018, unknown if currently breastfeeding. Maternal Exam:  Uterine Assessment: Contraction strength is mild.  Contraction frequency is irregular.   Abdomen: Patient reports no abdominal tenderness. Fundal height is 40 weeks.   Estimated fetal weight is 3200 grams.   Fetal presentation: vertex  Introitus: Normal vulva. Normal vagina.  Ferning test: not done.  Nitrazine test: not done.  Pelvis: adequate for delivery.   Cervix: Cervix evaluated by digital exam.   1/50/-3  Fetal Exam Fetal Monitor Review: Baseline rate: 125.  Variability: moderate (6-25 bpm).   Pattern: no decelerations and accelerations present.    Fetal State Assessment: Category I - tracings are normal.     Physical Exam  Nursing note and vitals reviewed. Constitutional: She appears well-developed and well-nourished.  Genitourinary:  Vulva normal.     Prenatal labs: ABO, Rh: --/--/B POS, B POS Performed at [facility], [address], [place], [place] [address]  [phone]) Antibody: NEG (12/30 0741) Rubella: Immune (06/02 0000) RPR: NON REACTIVE (12/30 0741)  HBsAg: Negative (06/02 0000)  HIV: Non-reactive (10/07 0000)  GBS: Negative/-- (11/30 0000)   Assessment/Plan: 28 year old G3P0020 at 39 weeks 5 days admitted to Labor and delivery for elective IOL Misoprostol for induction Continuous  monitoring Epidural on demand    Jacobey Gura 04/25/2019, 11:49 AM

## 2019-04-25 NOTE — Progress Notes (Signed)
Ashlee Romero is a 28 y.o. G3P0020 at [redacted]w[redacted]d by LMP admitted for induction of labor due to Elective at term.  Subjective: Patient starting to feel uncomfortable  Objective: BP 112/69   Pulse 84   Temp 98.8 F (37.1 C) (Oral)   Resp 16   Ht 5\' 4"  (1.626 m)   Wt 69.2 kg   LMP 07/21/2018 (Exact Date)   BMI 26.19 kg/m  No intake/output data recorded. No intake/output data recorded.  FHT:  FHR: 135 bpm, variability: moderate,  accelerations:  Present,  decelerations:  Present several episodes of repetitive late variable decels nadir 100's with immediate return to baseline. Pitocin turned off and maternal change of position showed some improvement.  UC:   irregular, every 2-6 minutes SVE:   Dilation: 3 Effacement (%): 70 Station: -3 Exam by: W Oakleigh Hesketh Attempt made to AROM unsuccessful +bloody show  Labs: Lab Results  Component Value Date   WBC 9.1 04/25/2019   HGB 11.0 (L) 04/25/2019   HCT 36.6 04/25/2019   MCV 68.3 (L) 04/25/2019   PLT 272 04/25/2019    Assessment / Plan: Induction of labor due to elective at term, with Cat II tracing Patient getting epidural now by anesthesia We discussed that if the fetus does not tolerate contractions we will consider Going back for a primary cesarean section.      Christyna Letendre 04/25/2019, 7:57 PM

## 2019-04-26 ENCOUNTER — Encounter (HOSPITAL_COMMUNITY): Payer: Self-pay | Admitting: Obstetrics & Gynecology

## 2019-04-26 ENCOUNTER — Encounter: Payer: Self-pay | Admitting: Medical

## 2019-04-26 DIAGNOSIS — U071 COVID-19: Secondary | ICD-10-CM | POA: Insufficient documentation

## 2019-04-26 LAB — CBC
HCT: 33.3 % — ABNORMAL LOW (ref 36.0–46.0)
Hemoglobin: 10.4 g/dL — ABNORMAL LOW (ref 12.0–15.0)
MCH: 21.2 pg — ABNORMAL LOW (ref 26.0–34.0)
MCHC: 31.2 g/dL (ref 30.0–36.0)
MCV: 67.8 fL — ABNORMAL LOW (ref 80.0–100.0)
Platelets: 231 10*3/uL (ref 150–400)
RBC: 4.91 MIL/uL (ref 3.87–5.11)
RDW: 16.9 % — ABNORMAL HIGH (ref 11.5–15.5)
WBC: 17.9 10*3/uL — ABNORMAL HIGH (ref 4.0–10.5)
nRBC: 0 % (ref 0.0–0.2)

## 2019-04-26 MED ORDER — PRENATAL MULTIVITAMIN CH
1.0000 | ORAL_TABLET | Freq: Every day | ORAL | Status: DC
  Administered 2019-04-26 – 2019-04-27 (×2): 1 via ORAL
  Filled 2019-04-26 (×2): qty 1

## 2019-04-26 MED ORDER — ONDANSETRON HCL 4 MG PO TABS: 4.0000 mg | ORAL_TABLET | ORAL | Status: DC | PRN

## 2019-04-26 MED ORDER — IBUPROFEN 600 MG PO TABS
600.0000 mg | ORAL_TABLET | Freq: Four times a day (QID) | ORAL | Status: DC
  Administered 2019-04-26 – 2019-04-27 (×5): 600 mg via ORAL
  Filled 2019-04-26 (×6): qty 1

## 2019-04-26 MED ORDER — ONDANSETRON HCL 4 MG/2ML IJ SOLN: 4.0000 mg | INTRAMUSCULAR | Status: DC | PRN

## 2019-04-26 MED ORDER — FERROUS SULFATE 325 (65 FE) MG PO TABS
325.0000 mg | ORAL_TABLET | Freq: Every day | ORAL | Status: DC
  Administered 2019-04-26 – 2019-04-27 (×2): 325 mg via ORAL
  Filled 2019-04-26 (×2): qty 1

## 2019-04-26 MED ORDER — DIBUCAINE (PERIANAL) 1 % EX OINT: 1.0000 "application " | TOPICAL_OINTMENT | CUTANEOUS | Status: DC | PRN

## 2019-04-26 MED ORDER — COCONUT OIL OIL
1.0000 "application " | TOPICAL_OIL | Status: DC | PRN
  Administered 2019-04-26: 1 via TOPICAL

## 2019-04-26 MED ORDER — OXYCODONE-ACETAMINOPHEN 5-325 MG PO TABS: 2.0000 | ORAL_TABLET | ORAL | Status: DC | PRN

## 2019-04-26 MED ORDER — SIMETHICONE 80 MG PO CHEW: 80.0000 mg | CHEWABLE_TABLET | ORAL | Status: DC | PRN

## 2019-04-26 MED ORDER — ZOLPIDEM TARTRATE 5 MG PO TABS: 5.0000 mg | ORAL_TABLET | Freq: Every evening | ORAL | Status: DC | PRN

## 2019-04-26 MED ORDER — SENNOSIDES-DOCUSATE SODIUM 8.6-50 MG PO TABS
2.0000 | ORAL_TABLET | ORAL | Status: DC
  Administered 2019-04-26: 2 via ORAL
  Filled 2019-04-26: qty 2

## 2019-04-26 MED ORDER — ACETAMINOPHEN 325 MG PO TABS: 650.0000 mg | ORAL_TABLET | ORAL | Status: DC | PRN

## 2019-04-26 MED ORDER — WITCH HAZEL-GLYCERIN EX PADS: 1.0000 "application " | MEDICATED_PAD | CUTANEOUS | Status: DC | PRN

## 2019-04-26 MED ORDER — METHYLERGONOVINE MALEATE 0.2 MG PO TABS: 0.2000 mg | ORAL_TABLET | ORAL | Status: DC | PRN

## 2019-04-26 MED ORDER — BENZOCAINE-MENTHOL 20-0.5 % EX AERO
1.0000 "application " | INHALATION_SPRAY | CUTANEOUS | Status: DC | PRN
  Administered 2019-04-26 (×2): 1 via TOPICAL
  Filled 2019-04-26: qty 56

## 2019-04-26 MED ORDER — OXYCODONE-ACETAMINOPHEN 5-325 MG PO TABS: 1.0000 | ORAL_TABLET | ORAL | Status: DC | PRN

## 2019-04-26 MED ORDER — DIPHENHYDRAMINE HCL 25 MG PO CAPS: 25.0000 mg | ORAL_CAPSULE | Freq: Four times a day (QID) | ORAL | Status: DC | PRN

## 2019-04-26 MED ORDER — METHYLERGONOVINE MALEATE 0.2 MG/ML IJ SOLN: 0.2000 mg | INTRAMUSCULAR | Status: DC | PRN

## 2019-04-26 MED ORDER — TETANUS-DIPHTH-ACELL PERTUSSIS 5-2.5-18.5 LF-MCG/0.5 IM SUSP: 0.5000 mL | Freq: Once | INTRAMUSCULAR | Status: DC

## 2019-04-26 NOTE — Lactation Note (Signed)
This note was copied from a baby's chart. Lactation Consultation Note  Patient Name: Girl Naryah Clenney [id number] Date: 04/26/2019 Reason for consult: Follow-up assessment;Term;Primapara;1st time breastfeeding  P1 mother whose infant is now 84 hours old.  Mother is Covid+.    Baby was asleep in bassinet when I arrived.  Asked mother to put on her mask before I came to the bedside.  Mother obliged my request.  Mother has latched baby multiple times since birth and feels like she is feeding well.  Mother's breasts are soft and non tender and nipples are everted and intact.  Discussed how to obtain and maintain a good latch.  Mother denies pain with latching.  Discussed breast feeding basics.  Encouraged mother to feed 8-12 times/24 hours or sooner if baby shows feeding cues.  Mother did not wish to review hand expression and states she is able to obtain colostrum drops.  Container provided and milk storage times reviewed.  Finger feeding demonstrated.  Encouraged mother to call for latch assistance as needed.  Mother has a DEBP for home use.  Father present and asleep on the couch.  RN will provide coconut oil on her next rounds.  RN updated.   Maternal Data Formula Feeding for Exclusion: Yes Reason for exclusion: Mother's choice to formula and breast feed on admission Has patient been taught Hand Expression?: Yes Does the patient have breastfeeding experience prior to this delivery?: No  Feeding Feeding Type: Breast Fed  LATCH Score Latch: Grasps breast easily, tongue down, lips flanged, rhythmical sucking.  Audible Swallowing: A few with stimulation  Type of Nipple: Everted at rest and after stimulation  Comfort (Breast/Nipple): Soft / non-tender  Hold (Positioning): Assistance needed to correctly position infant at breast and maintain latch.  LATCH Score: 8  Interventions Interventions: Assisted with latch;Skin to skin;Breast compression;Support pillows;Adjust  position  Lactation Tools Discussed/Used     Consult Status Consult Status: Follow-up Date: 04/27/19 Follow-up type: In-patient    Ridhima Golberg R Mitul Hallowell 04/26/2019, 3:09 PM

## 2019-04-26 NOTE — Anesthesia Postprocedure Evaluation (Signed)
Anesthesia Post Note  Patient: Ashlee Romero  Procedure(s) Performed: AN AD [place]     Patient location during evaluation: Mother Baby Anesthesia Type: Epidural Level of consciousness: awake and alert and oriented Pain management: satisfactory to patient Vital Signs Assessment: post-procedure vital signs reviewed and stable Respiratory status: spontaneous breathing and nonlabored ventilation Cardiovascular status: stable Postop Assessment: no headache, no backache, no signs of nausea or vomiting, adequate PO intake, patient able to bend at knees and able to ambulate (patient up walking) Anesthetic complications: no    Last Vitals:  Vitals:   04/26/19 0900 04/26/19 1528  BP: 106/77 109/82  Pulse: 79 86  Resp: 20 16  Temp: 36.9 C 36.9 C  SpO2: 100% 100%    Last Pain:  Vitals:   04/26/19 1528  TempSrc: Oral  PainSc:    Pain Goal:                   Seyon Strader

## 2019-04-26 NOTE — Progress Notes (Signed)
Called to room by patient for concerns of bleeding. Patient texting on phone when I entered the room. Peri pad was wet with watery bloody lochia. Fundus firm. Upon fundal massage pt urinating with small lochia noted. Assisted to the bathroom, pt voided a small amount, tolerated OOB well. Back to bed, fundus firm and assisted with breastfeeding.

## 2019-04-26 NOTE — Progress Notes (Signed)
MOB was referred for history of depression/anxiety. * Referral screened out by Clinical Social Worker because none of the following criteria appear to apply: ~ History of anxiety/depression during this pregnancy, or of post-partum depression following prior delivery. ~ Diagnosis of anxiety and/or depression within last 3 years. Per further chart review, MOB appears to have been diagnosed with depression/anxiety in 2017. OR * MOB's symptoms currently being treated with medication and/or therapy.   Please contact the Clinical Social Worker if needs arise, by MOB request, or if MOB scores greater than 9/yes to question 10 on Edinburgh Postpartum Depression Screen.     Rashaud Ybarbo S. Avonell Lenig, MSW, LCSW [facility] [phone]   

## 2019-04-26 NOTE — Progress Notes (Signed)
Called to room by patient for concerns of "gushing blood". Moderate lochia noted in pad. Fundal massage produced 2 plum size clots that measured 120 ml total per Triton. Patient's uterus firm at U/2. Stayed with patient for approximately 20 minutes. Uterus remained firm and scant amount of bleeding noted. Instructed patient to call if increase bleeding, passing clots or increased uterine cramping. Support person at bedside. Patient resting.

## 2019-04-27 MED ORDER — ACETAMINOPHEN 325 MG PO TABS: 650.0000 mg | ORAL_TABLET | ORAL | Status: AC | PRN

## 2019-04-27 MED ORDER — IBUPROFEN 600 MG PO TABS: 600.0000 mg | ORAL_TABLET | Freq: Four times a day (QID) | ORAL | 0 refills | Status: AC

## 2019-04-27 NOTE — Discharge Summary (Signed)
OB Discharge Summary     Patient Name: Ashlee Romero DOB: 1990/06/03 MRN: [id number]  Date of admission: 04/25/2019 Delivering MD: Essie Hart   Date of discharge: 04/27/2019  Admitting diagnosis: Encounter for elective induction of labor [Z34.90] Intrauterine pregnancy: [redacted]w[redacted]d     Secondary diagnosis:  Active Problems:   SVD (12/31)   Encounter for elective induction of labor  Additional problems: Hx of depression     Discharge diagnosis: Term Pregnancy Delivered                                                                                                Post partum procedures:none  Augmentation: AROM, Pitocin and Cytotec  Complications: None  Hospital course:  Induction of Labor With Vaginal Delivery   29 y.o. yo [id number] at [redacted]w[redacted]d was admitted to the hospital 04/25/2019 for induction of labor.  Indication for induction: Elective at term.  Patient had an uncomplicated labor course as follows: Membrane Rupture Time/Date: 8:18 PM ,04/25/2019   Intrapartum Procedures: Episiotomy: None [1]                                         Lacerations:  None [1]  Patient had delivery of a Viable infant.  Information for the patient's newborn:  Dynasty, Holquin [[id number]]      04/26/2019  Details of delivery can be found in separate delivery note.  Patient had a routine postpartum course. Patient is discharged home 04/27/19.  Physical exam  Vitals:   04/26/19 1528 04/26/19 1740 04/26/19 2015 04/27/19 0533  BP: 109/82 97/70 103/70 104/72  Pulse: 86 82 80 85  Resp: 16 18 16 16   Temp: 98.4 F (36.9 C) 98.6 F (37 C) 98.6 F (37 C) 98 F (36.7 C)  TempSrc: Oral Oral Oral Oral  SpO2: 100% 99% 96% 100%  Weight:      Height:       General: alert, cooperative and no distress Lochia: appropriate Uterine Fundus: firm Perineum: intact, non-edematous DVT Evaluation: No evidence of DVT seen on physical exam. No cords or calf tenderness. No significant calf/ankle  edema. Labs: Lab Results  Component Value Date   WBC 17.9 (H) 04/26/2019   HGB 10.4 (L) 04/26/2019   HCT 33.3 (L) 04/26/2019   MCV 67.8 (L) 04/26/2019   PLT 231 04/26/2019   No flowsheet data found.  Discharge instruction: per After Visit Summary and "Baby and Me Booklet".  After visit meds:  Allergies as of 04/27/2019      Reactions   Sulfa Antibiotics Hives      Medication List    STOP taking these medications   ferrous sulfate 325 (65 FE) MG EC tablet   metroNIDAZOLE 500 MG tablet Commonly known as: FLAGYL   multivitamin with minerals Tabs tablet   oxyCODONE-acetaminophen 5-325 MG tablet Commonly known as: Roxicet     TAKE these medications   acetaminophen 325 MG tablet Commonly known as: Tylenol Take 2 tablets (650 mg total) by mouth every 4 (  four) hours as needed (for pain scale < 4).   ibuprofen 600 MG tablet Commonly known as: ADVIL Take 1 tablet (600 mg total) by mouth every 6 (six) hours.       Diet: routine diet  Activity: Advance as tolerated. Pelvic rest for 6 weeks.   Outpatient follow IW:LNLG a telehealth appointment in 2 weeks for mood assessment and then keep scheduled 6 week appointment on 06/07/19 for routine postpartum visit Follow up Appt:No future appointments. Follow up Visit:No follow-ups on file.  Postpartum contraception: Undecided  Newborn Data: Live born female  Birth Weight: 7 lb 5.1 oz (3320 g) APGAR: 8, 9  Newborn Delivery   Birth date/time: 04/26/2019 02:23:00 Delivery type: Vaginal, Spontaneous      Baby Feeding: Breast Disposition:home with mother   04/27/2019 Arrie Eastern, CNM

## 2019-05-02 ENCOUNTER — Other Ambulatory Visit: Payer: Self-pay | Admitting: Cardiology

## 2019-05-02 DIAGNOSIS — Z20822 Contact with and (suspected) exposure to covid-19: Secondary | ICD-10-CM

## 2019-05-04 LAB — NOVEL CORONAVIRUS, NAA: SARS-CoV-2, NAA: NOT DETECTED

## 2019-05-10 ENCOUNTER — Other Ambulatory Visit: Payer: Self-pay | Admitting: Student

## 2019-05-10 DIAGNOSIS — U071 COVID-19: Secondary | ICD-10-CM

## 2019-06-22 ENCOUNTER — Other Ambulatory Visit: Payer: Self-pay | Admitting: Student

## 2021-05-16 ENCOUNTER — Other Ambulatory Visit: Payer: Self-pay

## 2021-05-16 ENCOUNTER — Emergency Department (HOSPITAL_COMMUNITY): Payer: BC Managed Care – PPO

## 2021-05-16 ENCOUNTER — Emergency Department (HOSPITAL_COMMUNITY)
Admission: EM | Admit: 2021-05-16 | Discharge: 2021-05-16 | Disposition: A | Discharge status: Home / Self Care | Payer: BC Managed Care – PPO | Attending: Emergency Medicine | Admitting: Emergency Medicine

## 2021-05-16 DIAGNOSIS — N9489 Other specified conditions associated with female genital organs and menstrual cycle: Secondary | ICD-10-CM | POA: Insufficient documentation

## 2021-05-16 DIAGNOSIS — N939 Abnormal uterine and vaginal bleeding, unspecified: Secondary | ICD-10-CM | POA: Diagnosis not present

## 2021-05-16 DIAGNOSIS — R1031 Right lower quadrant pain: Secondary | ICD-10-CM | POA: Diagnosis present

## 2021-05-16 LAB — CBC
HCT: 40.1 % (ref 36.0–46.0)
Hemoglobin: 12.2 g/dL (ref 12.0–15.0)
MCH: 21.1 pg — ABNORMAL LOW (ref 26.0–34.0)
MCHC: 30.4 g/dL (ref 30.0–36.0)
MCV: 69.3 fL — ABNORMAL LOW (ref 80.0–100.0)
Platelets: 431 10*3/uL — ABNORMAL HIGH (ref 150–400)
RBC: 5.79 MIL/uL — ABNORMAL HIGH (ref 3.87–5.11)
RDW: 15.7 % — ABNORMAL HIGH (ref 11.5–15.5)
WBC: 7.1 10*3/uL (ref 4.0–10.5)
nRBC: 0 % (ref 0.0–0.2)

## 2021-05-16 LAB — COMPREHENSIVE METABOLIC PANEL
ALT: 28 U/L (ref 0–44)
AST: 18 U/L (ref 15–41)
Albumin: 3.2 g/dL — ABNORMAL LOW (ref 3.5–5.0)
Alkaline Phosphatase: 78 U/L (ref 38–126)
Anion gap: 7 (ref 5–15)
BUN: 6 mg/dL (ref 6–20)
CO2: 23 mmol/L (ref 22–32)
Calcium: 8.8 mg/dL — ABNORMAL LOW (ref 8.9–10.3)
Chloride: 108 mmol/L (ref 98–111)
Creatinine, Ser: 0.9 mg/dL (ref 0.44–1.00)
GFR, Estimated: >60 mL/min (ref >60)
Glucose, Bld: 106 mg/dL — ABNORMAL HIGH (ref 70–99)
Potassium: 3.7 mmol/L (ref 3.5–5.1)
Sodium: 138 mmol/L (ref 135–145)
Total Bilirubin: 0.3 mg/dL (ref 0.3–1.2)
Total Protein: 7.1 g/dL (ref 6.5–8.1)

## 2021-05-16 LAB — URINALYSIS, ROUTINE W REFLEX MICROSCOPIC
Bilirubin Urine: NEGATIVE
Glucose, UA: NEGATIVE mg/dL
Ketones, ur: NEGATIVE mg/dL
Nitrite: NEGATIVE
Protein, ur: 100 mg/dL — AB
RBC / HPF: >50 RBC/hpf — ABNORMAL HIGH (ref 0–5)
Specific Gravity, Urine: 1.019 (ref 1.005–1.030)
pH: 5 (ref 5.0–8.0)

## 2021-05-16 LAB — I-STAT BETA HCG BLOOD, ED (MC, WL, AP ONLY): I-stat hCG, quantitative: <5 m[IU]/mL (ref <5)

## 2021-05-16 LAB — LIPASE, BLOOD: Lipase: 49 U/L (ref 11–51)

## 2021-05-16 LAB — HCG, QUANTITATIVE, PREGNANCY: hCG, Beta Chain, Quant, S: <1 m[IU]/mL (ref <5)

## 2021-05-16 NOTE — ED Provider Notes (Addendum)
[facility] EMERGENCY DEPARTMENT Provider Note   CSN: [id number] Arrival date & time: 05/16/21  1116     History  Chief Complaint  Patient presents with   Vaginal Bleeding   Abdominal Pain    Right Lower    Ashlee Romero is a 31 y.o. female.  Patient followed by [facility] OB[facility] Dr. Mora Appl.  Patient been having vaginal bleeding problems since the beginning of December.  They originally put her on continuous birth control pills without taking the placebo.  That did stop her vaginal bleeding on December 14.  But then it reoccurred a week later.  Next follow-up is scheduled March 8.  Patient is been trying to get a hold of them for additional follow-up.  Patient also had ultrasound done by them that raised concerns for complex cyst on the right side and regular ovarian cyst on the left side.  Patient's had problems like this in the past and basically had a D&C of her uterine lining to help control it.  But this would have been several years ago.  Patient is having abdominal cramping vaginal bleeding and increased discomfort in the right lower quadrant.  Past medical history other than what is mentioned above is noncontributory.  Looks like patient had laparoscopic ovarian cystectomy in May 2017.  And the D&C mention.      Home Medications Prior to Admission medications   Medication Sig Start Date End Date Taking? Authorizing Provider  acetaminophen (TYLENOL) 325 MG tablet Take 2 tablets (650 mg total) by mouth every 4 (four) hours as needed (for pain scale < 4). 04/27/19   Roma Schanz, CNM  ibuprofen (ADVIL) 600 MG tablet Take 1 tablet (600 mg total) by mouth every 6 (six) hours. 04/27/19   Roma Schanz, CNM      Allergies    Sulfa antibiotics    Review of Systems   Review of Systems  Constitutional:  Negative for chills and fever.  HENT:  Negative for ear pain and sore throat.   Eyes:  Negative for pain and visual disturbance.  Respiratory:  Negative  for cough and shortness of breath.   Cardiovascular:  Negative for chest pain and palpitations.  Gastrointestinal:  Positive for abdominal pain. Negative for vomiting.  Genitourinary:  Positive for vaginal bleeding. Negative for dysuria and hematuria.  Musculoskeletal:  Negative for arthralgias and back pain.  Skin:  Negative for color change and rash.  Neurological:  Negative for seizures and syncope.  All other systems reviewed and are negative.  Physical Exam Updated Vital Signs BP (!) 131/100    Pulse 100    Temp 98.8 F (37.1 C)    Resp 20    Ht 1.626 m (5\' 4" )    Wt 61.2 kg    SpO2 100%    BMI 23.17 kg/m  Physical Exam Vitals and nursing note reviewed.  Constitutional:      General: She is not in acute distress.    Appearance: She is well-developed. She is not toxic-appearing.  HENT:     Head: Normocephalic and atraumatic.  Eyes:     Conjunctiva/sclera: Conjunctivae normal.  Cardiovascular:     Rate and Rhythm: Normal rate and regular rhythm.     Heart sounds: No murmur heard. Pulmonary:     Effort: Pulmonary effort is normal. No respiratory distress.     Breath sounds: Normal breath sounds.  Abdominal:     Palpations: Abdomen is soft.     Tenderness: There  is no abdominal tenderness. There is no guarding.  Musculoskeletal:        General: No swelling.     Cervical back: Normal range of motion and neck supple.  Skin:    General: Skin is warm and dry.     Capillary Refill: Capillary refill takes less than 2 seconds.  Neurological:     Mental Status: She is alert.  Psychiatric:        Mood and Affect: Mood normal.    ED Results / Procedures / Treatments   Labs (all labs ordered are listed, but only abnormal results are displayed) Labs Reviewed  COMPREHENSIVE METABOLIC PANEL - Abnormal; Notable for the following components:      Result Value   Glucose, Bld 106 (*)    Calcium 8.8 (*)    Albumin 3.2 (*)    All other components within normal limits  CBC -  Abnormal; Notable for the following components:   RBC 5.79 (*)    MCV 69.3 (*)    MCH 21.1 (*)    RDW 15.7 (*)    Platelets 431 (*)    All other components within normal limits  URINALYSIS, ROUTINE W REFLEX MICROSCOPIC - Abnormal; Notable for the following components:   Color, Urine AMBER (*)    APPearance CLOUDY (*)    Hgb urine dipstick LARGE (*)    Protein, ur 100 (*)    Leukocytes,Ua TRACE (*)    RBC / HPF >50 (*)    Bacteria, UA FEW (*)    All other components within normal limits  LIPASE, BLOOD  HCG, QUANTITATIVE, PREGNANCY  I-STAT BETA HCG BLOOD, ED ([facility], [facility], [facility] ONLY)    EKG None  Radiology No results found.  Procedures Procedures    Medications Ordered in ED Medications - No data to display  ED Course/ Medical Decision Making/ A&P                           Medical Decision Making Amount and/or Complexity of Data Reviewed Labs: ordered. Radiology: ordered. ECG/medicine tests: ordered.   Patient hemodynamically stable here.  Blood pressure 131/100.  Heart rate right around 100.  Hemoglobin normal at 12.  Urinalysis positive for blood but not consistent with urinary tract infection.  Complete metabolic panel without any acute findings.  Report from the lab the patient's hCG is negative.  Quantitative hCG is still in process.  Based on this we will get ultrasound for reevaluation of the ovaries rule out any torsion.  But clinically patient not in significant pain suggestive of torsion.  But will further evaluate this complex cyst.  If ultrasound is completely normal may need to do a CT scan just to rule out other right lower quadrant pathology.  Patient's labs do not suggest any significant problem.  Patient's abdomen is completely nontender.  Also Dr. Alwyn Pea is on-call for the patient's group so will follow up with her as well.   Final Clinical Impression(s) / ED Diagnoses Final diagnoses:  Vaginal bleeding    Rx / DC Orders ED Discharge Orders     None          Fredia Sorrow, MD 05/16/21 1348   Addendum: I got a hold of Dr. Alwyn Pea on-call for [facility].  She does want to go ahead and do not on OB ultrasound here today.  She will plan to follow-up with her with telehealth visit she will have her office contact  the patient.  And she wants to use her current birth complete control pills and take 3 pills for 3 days 2 pills for 3 more days and then 1 pill a day.     Fredia Sorrow, MD 05/16/21 1406  Ultrasound without any significant findings.  No markedly abnormal findings with bilateral adnexa.  The basis had no sniffing ultrasound abnormality of the pelvis to explain vaginal bleeding.  Based on this will do the birth control pill taper as the described.  And have her follow-up with Dr. Alwyn Pea.    Fredia Sorrow, MD 05/16/21 1513    Fredia Sorrow, MD 05/16/21 [phone]

## 2021-05-16 NOTE — Discharge Instructions (Signed)
Has per her conversation with Dr. Mora Appl.  She recommends taking your birth control pills 3 of them each day for 3 days, then 2 pills each day for 3 days, and then 1 pill a day.  Dr. Steele Berg will get a hold of you for through telemedicine or to interview you this week.  Ultrasound showed no evidence of any significant abnormalities.  And your labs were stable here today.  Return for any new or worse symptoms.

## 2021-05-16 NOTE — ED Triage Notes (Addendum)
Patient arrives with complaints recurrent vaginal bleeding x1 month. Patient was seen by her obgyn who told her to take her oral contraceptive (not missing any doses and to not take the placebos) to control the vaginal bleeding. Patient reports that the bleeding stopped briefly and started back a few weeks ago. She has been fatigued and developing worsening abdominal pain as well.  Patient has a recurrent right ovarian cyst as well that was the source of this issue when it happened before.

## 2022-07-19 IMAGING — US US PELVIS COMPLETE TRANSABD/TRANSVAG W DUPLEX AND/OR DOPPLER
1 series · 13 of 25 positions shown · non-contrast
Comparison: None.

CLINICAL DATA: Vaginal bleeding since 04/13/2022

EXAM:
TRANSABDOMINAL AND TRANSVAGINAL ULTRASOUND OF PELVIS
DOPPLER ULTRASOUND OF OVARIES
TECHNIQUE: Both transabdominal and transvaginal ultrasound examinations of the
pelvis were performed. Transabdominal technique was performed for
global imaging of the pelvis including uterus, ovaries, adnexal
regions, and pelvic cul-de-sac.
It was necessary to proceed with endovaginal exam following the
transabdominal exam to visualize the uterus, endometrium, ovaries
and adnexa. Color and duplex Doppler ultrasound was utilized to
evaluate blood flow to the ovaries.

[Series 1: us pelvic complete w transvaginal and torsion righ · 13 of 85 slices shown]
[im 1/85]
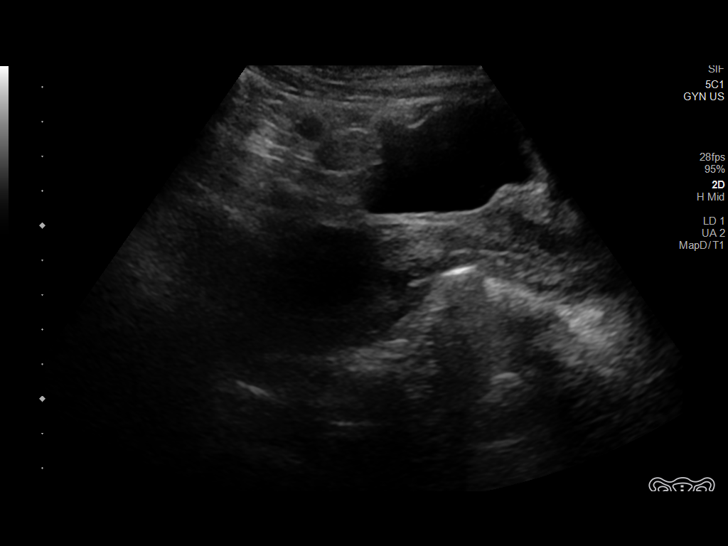
[im 8/85]
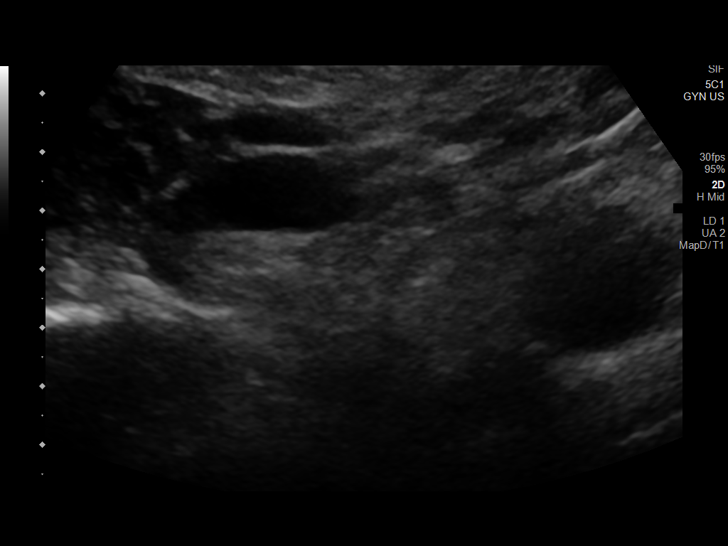
[im 15/85]
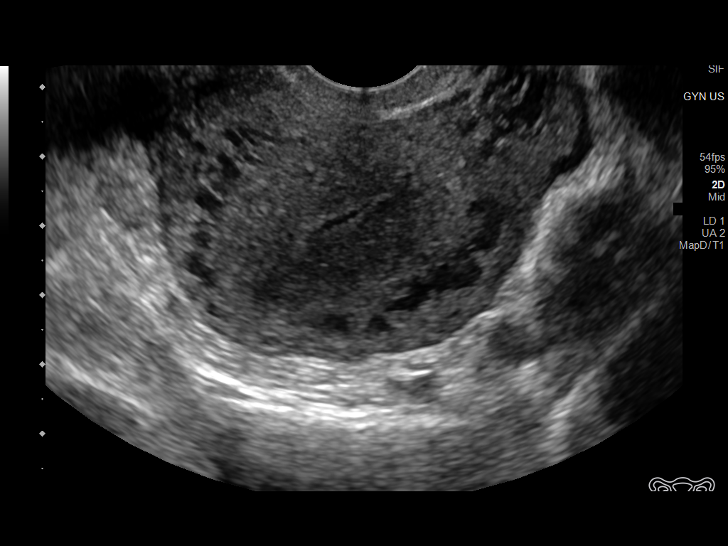
[im 22/85]
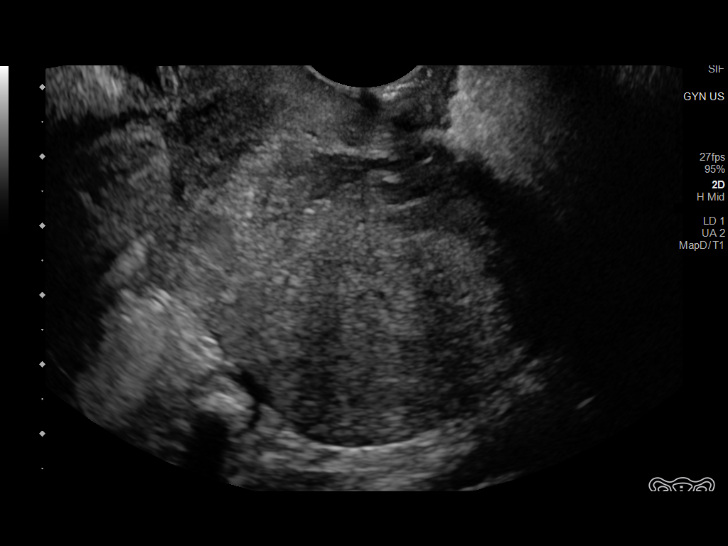
[im 29/85]
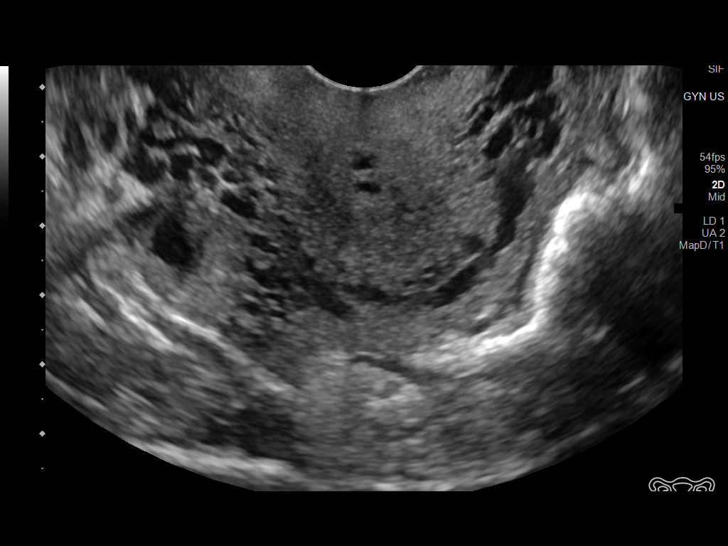
[im 36/85]
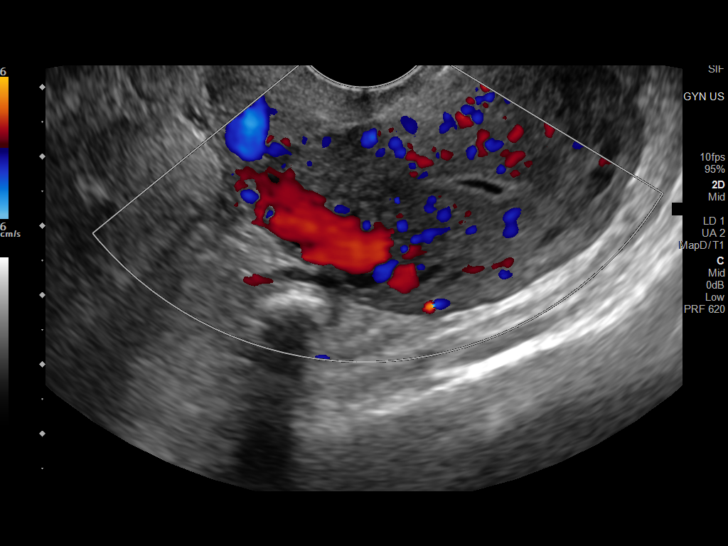
[im 43/85]
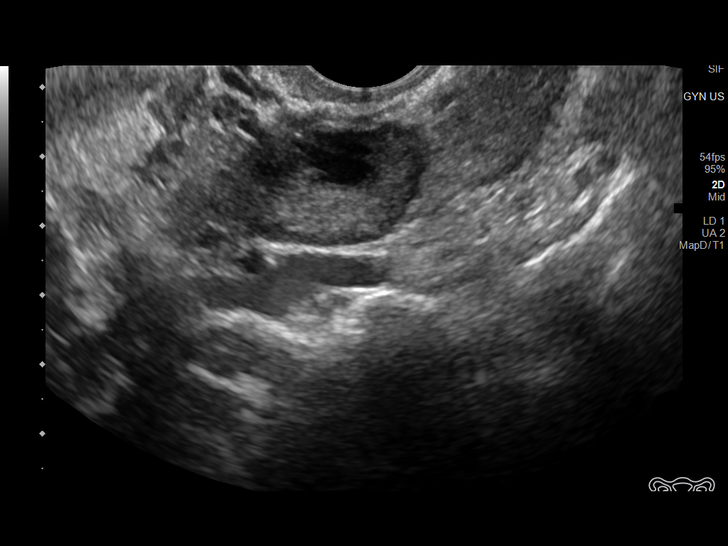
[im 50/85]
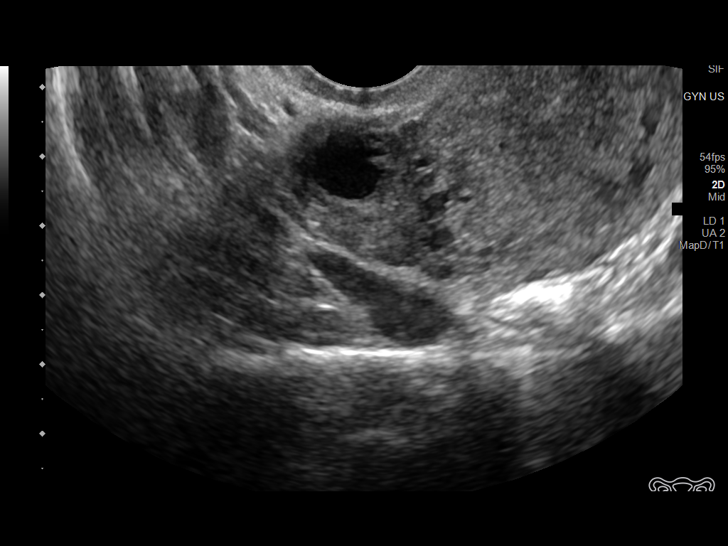
[im 57/85]
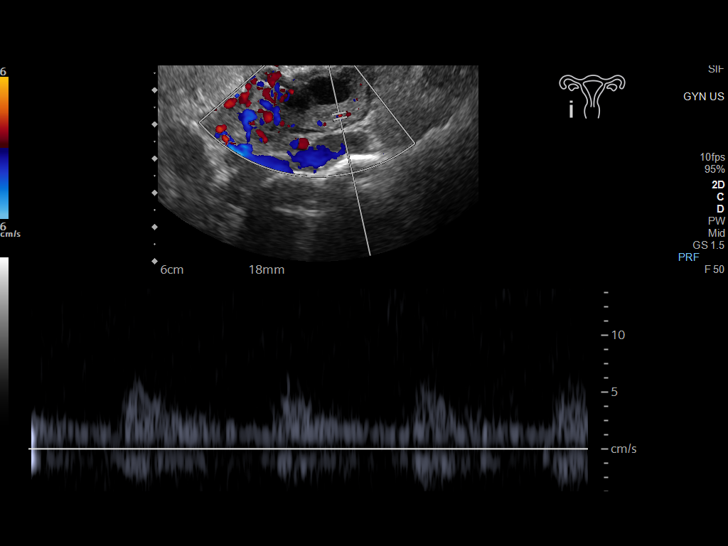
[im 64/85]
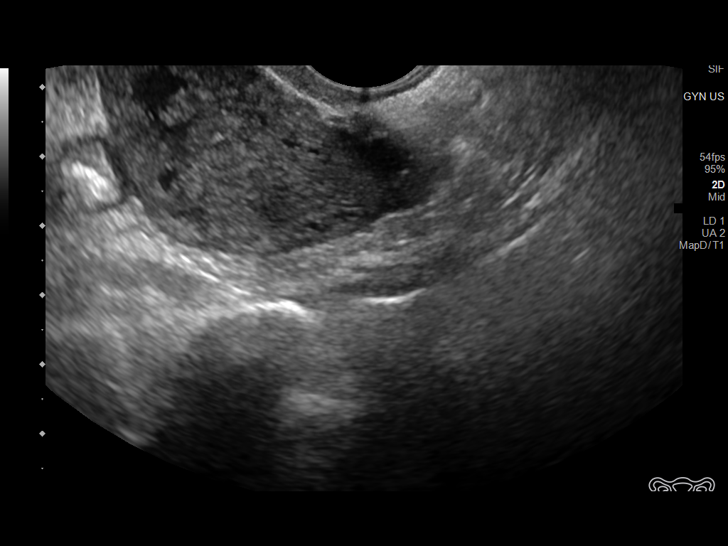
[im 71/85]
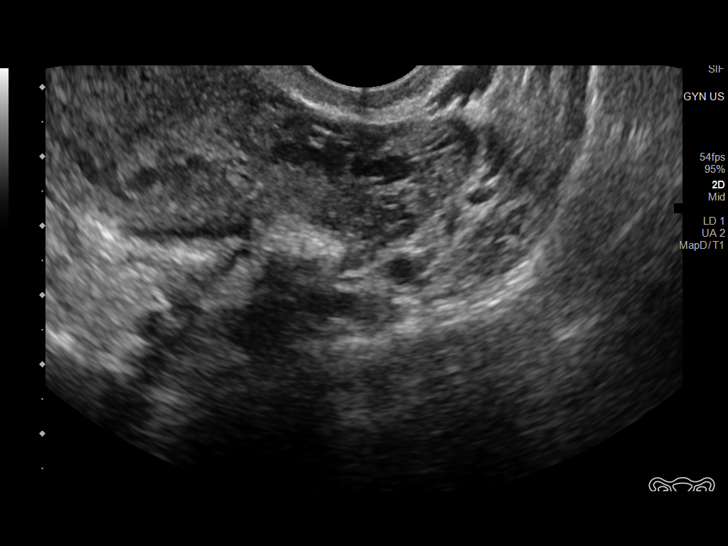
[im 78/85]
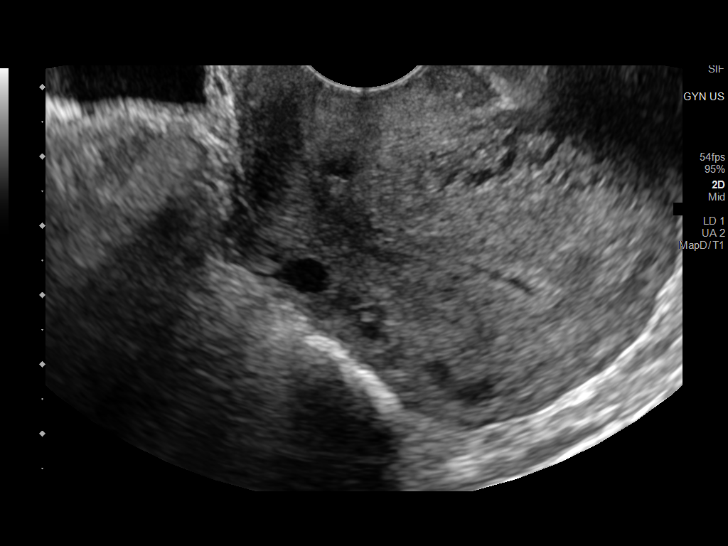
[im 85/85]
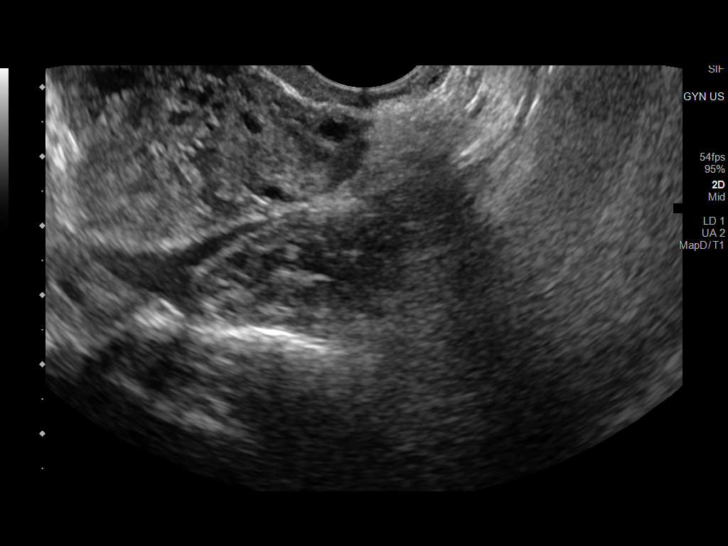

[13 of 25 positions shown; findings below may reference images not displayed]

FINDINGS: Uterus

Measurements: 6.5 x 5.2 x 5.3 cm = volume: 95 mL. No fibroids or
other mass visualized.

Endometrium

Thickness: 1 mm. No focal abnormality visualized. Small volume fluid
in the endometrial cavity.

Right ovary

Measurements: 3.0 x 1.7 x 1.7 cm = volume: 5 mL. Normal
appearance/no adnexal mass. Subcentimeter cysts and follicles.

Left ovary

Measurements: 3.0 x 1.2 x 2.2 cm = volume: 4 mL. Normal
appearance/no adnexal mass. Subcentimeter cysts and follicles.

Pulsed Doppler evaluation of both ovaries demonstrates normal
low-resistance arterial and venous waveforms.

Other findings

No abnormal free fluid.
IMPRESSION: 1. Small volume of free fluid in the endometrial cavity, in keeping
with reported vaginal bleeding.

2. No specific ultrasound abnormality of the pelvis to explain
vaginal bleeding.

## 2024-03-02 ENCOUNTER — Inpatient Hospital Stay (HOSPITAL_COMMUNITY)
Admission: AD | Admit: 2024-03-02 | Discharge: 2024-03-02 | Disposition: A | Discharge status: Home / Self Care | Attending: Obstetrics & Gynecology | Admitting: Obstetrics & Gynecology

## 2024-03-02 ENCOUNTER — Encounter (HOSPITAL_COMMUNITY): Payer: Self-pay | Admitting: Obstetrics & Gynecology

## 2024-03-02 ENCOUNTER — Inpatient Hospital Stay (HOSPITAL_COMMUNITY)

## 2024-03-02 DIAGNOSIS — O209 Hemorrhage in early pregnancy, unspecified: Secondary | ICD-10-CM | POA: Diagnosis present

## 2024-03-02 DIAGNOSIS — Z3A01 Less than 8 weeks gestation of pregnancy: Secondary | ICD-10-CM | POA: Diagnosis not present

## 2024-03-02 DIAGNOSIS — R109 Unspecified abdominal pain: Secondary | ICD-10-CM

## 2024-03-02 DIAGNOSIS — O26891 Other specified pregnancy related conditions, first trimester: Secondary | ICD-10-CM | POA: Diagnosis not present

## 2024-03-02 DIAGNOSIS — N939 Abnormal uterine and vaginal bleeding, unspecified: Secondary | ICD-10-CM

## 2024-03-02 DIAGNOSIS — N920 Excessive and frequent menstruation with regular cycle: Secondary | ICD-10-CM

## 2024-03-02 DIAGNOSIS — R1030 Lower abdominal pain, unspecified: Secondary | ICD-10-CM | POA: Insufficient documentation

## 2024-03-02 LAB — CBC
HCT: 39.3 % (ref 36.0–46.0)
Hemoglobin: 12.3 g/dL (ref 12.0–15.0)
MCH: 21.5 pg — ABNORMAL LOW (ref 26.0–34.0)
MCHC: 31.3 g/dL (ref 30.0–36.0)
MCV: 68.7 fL — ABNORMAL LOW (ref 80.0–100.0)
Platelets: 261 K/uL (ref 150–400)
RBC: 5.72 MIL/uL — ABNORMAL HIGH (ref 3.87–5.11)
RDW: 15.9 % — ABNORMAL HIGH (ref 11.5–15.5)
WBC: 6.9 K/uL (ref 4.0–10.5)
nRBC: 0 % (ref 0.0–0.2)

## 2024-03-02 LAB — URINALYSIS, ROUTINE W REFLEX MICROSCOPIC
Bilirubin Urine: NEGATIVE
Glucose, UA: NEGATIVE mg/dL
Ketones, ur: NEGATIVE mg/dL
Nitrite: NEGATIVE
Protein, ur: 30 mg/dL — AB
Specific Gravity, Urine: 1.023 (ref 1.005–1.030)
pH: 6 (ref 5.0–8.0)

## 2024-03-02 LAB — HIV ANTIBODY (ROUTINE TESTING W REFLEX): HIV Screen 4th Generation wRfx: NONREACTIVE

## 2024-03-02 LAB — WET PREP, GENITAL
Clue Cells Wet Prep HPF POC: NONE SEEN
Sperm: NONE SEEN
Trich, Wet Prep: NONE SEEN
WBC, Wet Prep HPF POC: <10 (ref <10)
Yeast Wet Prep HPF POC: NONE SEEN

## 2024-03-02 LAB — HCG, QUANTITATIVE, PREGNANCY: hCG, Beta Chain, Quant, S: >250000 m[IU]/mL — ABNORMAL HIGH (ref <5)

## 2024-03-02 LAB — POCT PREGNANCY, URINE: Preg Test, Ur: POSITIVE — AB

## 2024-03-02 LAB — ABO/RH: ABO/RH(D): B POS

## 2024-03-02 NOTE — MAU Provider Note (Signed)
 History     CSN: [id number]  Arrival date and time: 03/02/24 0857   Event Date/Time   First Provider Initiated Contact with Patient 03/02/24 1144      Chief Complaint  Patient presents with   Vaginal Bleeding   Ashlee Romero , a  33 y.o. [id number] at [redacted]w[redacted]d presents to MAU after speaking with her OBGYN for vaginal spotting. Patient states that yesterday she had light pink vaginal spotting. Pt reports that it was a 1 time occurrence with wiping. Denies any today. She endorses very mild vagina and lower abdominal cramping currently rating a 1-2. States that its relieved by walking. She denies recent intercourse, other abnormal vaginal discharge and urinary symptoms.          OB History     Gravida  4   Para  1   Term  1   Preterm      AB  2   Living  1      SAB  1   IAB  1   Ectopic      Multiple  0   Live Births  1           Past Medical History:  Diagnosis Date   Anemia    history of   Anxiety    Depression    Medical history non-contributory    No pertinent past medical history     Past Surgical History:  Procedure Laterality Date   DILATION AND CURETTAGE OF UTERUS     LAPAROSCOPIC OVARIAN CYSTECTOMY Right 09/09/2015   Procedure: LAPAROSCOPIC OVARIAN CYSTECTOMY;  Surgeon: Gigi Botts, MD;  Location: [facility];  Service: Gynecology;  Laterality: Right;   THERAPEUTIC ABORTION      Family History  Problem Relation Age of Onset   Hyperlipidemia Mother    Anxiety disorder Maternal Aunt    Depression Maternal Aunt    Miscarriages / Stillbirths Maternal Aunt    Anxiety disorder Paternal Aunt    Depression Paternal Aunt    Hypertension Maternal Grandmother    Miscarriages / Stillbirths Maternal Grandmother    Alcohol abuse Maternal Grandfather    Anesthesia problems Neg Hx     Social History   Tobacco Use   Smoking status: Never   Smokeless tobacco: Never  Substance Use Topics   Alcohol use: No   Drug use: No    Allergies:  Allergies   Allergen Reactions   Sulfa Antibiotics Hives    Medications Prior to Admission  Medication Sig Dispense Refill Last Dose/Taking   acetaminophen  (TYLENOL ) 325 MG tablet Take 2 tablets (650 mg total) by mouth every 4 (four) hours as needed (for pain scale < 4).      ibuprofen  (ADVIL ) 600 MG tablet Take 1 tablet (600 mg total) by mouth every 6 (six) hours. 30 tablet 0     Review of Systems  Constitutional:  Negative for chills, fatigue and fever.  Eyes:  Negative for pain and visual disturbance.  Respiratory:  Negative for apnea, shortness of breath and wheezing.   Cardiovascular:  Negative for chest pain and palpitations.  Gastrointestinal:  Negative for abdominal pain, constipation, diarrhea, nausea and vomiting.  Genitourinary:  Positive for vaginal bleeding. Negative for difficulty urinating, dysuria, pelvic pain, vaginal discharge and vaginal pain.  Musculoskeletal:  Negative for back pain.  Neurological:  Negative for seizures, weakness and headaches.  Psychiatric/Behavioral:  Negative for suicidal ideas.    Physical Exam   Blood pressure 115/82, pulse 92, temperature 98.9 F (  37.2 C), resp. rate 18, height 5' 4.5 (1.638 m), weight 61.9 kg, last menstrual period 01/09/2024, unknown if currently breastfeeding.  Physical Exam Vitals and nursing note reviewed.  Constitutional:      General: She is not in acute distress.    Appearance: Normal appearance.  HENT:     Head: Normocephalic.  Pulmonary:     Effort: Pulmonary effort is normal.  Musculoskeletal:     Cervical back: Normal range of motion.  Skin:    General: Skin is warm and dry.  Neurological:     Mental Status: She is alert and oriented to person, place, and time.  Psychiatric:        Mood and Affect: Mood normal.     MAU Course  Procedures Orders Placed This Encounter  Procedures   Wet prep, genital   Culture, OB Urine   US  OB Comp Less 14 Wks   hCG, quantitative, pregnancy   Urinalysis, Routine w  reflex microscopic -Urine, Clean Catch   HIV Antibody (routine testing w rflx)   CBC   Diet NPO time specified   Pregnancy, urine POC   ABO/Rh   Discharge patient Discharge disposition: 01-Home or Self Care; Discharge patient date: 03/02/2024   Results for orders placed or performed during the hospital encounter of 03/02/24 (from the past 24 hours)  hCG, quantitative, pregnancy     Status: Abnormal   Collection Time: 03/02/24  9:33 AM  Result Value Ref Range   hCG, Beta Chain, Quant, S >250,000 (H) <5 mIU/mL  HIV Antibody (routine testing w rflx)     Status: None   Collection Time: 03/02/24  9:33 AM  Result Value Ref Range   HIV Screen 4th Generation wRfx Non Reactive Non Reactive  Urinalysis, Routine w reflex microscopic -Urine, Clean Catch     Status: Abnormal   Collection Time: 03/02/24  9:36 AM  Result Value Ref Range   Color, Urine YELLOW YELLOW   APPearance CLOUDY (A) CLEAR   Specific Gravity, Urine 1.023 1.005 - 1.030   pH 6.0 5.0 - 8.0   Glucose, UA NEGATIVE NEGATIVE mg/dL   Hgb urine dipstick SMALL (A) NEGATIVE   Bilirubin Urine NEGATIVE NEGATIVE   Ketones, ur NEGATIVE NEGATIVE mg/dL   Protein, ur 30 (A) NEGATIVE mg/dL   Nitrite NEGATIVE NEGATIVE   Leukocytes,Ua MODERATE (A) NEGATIVE   RBC / HPF 6-10 0 - 5 RBC/hpf   WBC, UA 6-10 0 - 5 WBC/hpf   Bacteria, UA RARE (A) NONE SEEN   Squamous Epithelial / HPF 11-20 0 - 5 /HPF   Mucus PRESENT   Wet prep, genital     Status: None   Collection Time: 03/02/24  9:36 AM  Result Value Ref Range   Yeast Wet Prep HPF POC NONE SEEN NONE SEEN   Trich, Wet Prep NONE SEEN NONE SEEN   Clue Cells Wet Prep HPF POC NONE SEEN NONE SEEN   WBC, Wet Prep HPF POC <10 <10   Sperm NONE SEEN   Pregnancy, urine POC     Status: Abnormal   Collection Time: 03/02/24  9:44 AM  Result Value Ref Range   Preg Test, Ur POSITIVE (A) NEGATIVE  ABO/Rh     Status: None   Collection Time: 03/02/24 10:14 AM  Result Value Ref Range   ABO/RH(D)      B  POS Performed at [facility], [address] [place], [place] [address]   CBC     Status: Abnormal  Collection Time: 03/02/24 10:16 AM  Result Value Ref Range   WBC 6.9 4.0 - 10.5 K/uL   RBC 5.72 (H) 3.87 - 5.11 MIL/uL   Hemoglobin 12.3 12.0 - 15.0 g/dL   HCT 60.6 63.9 - 53.9 %   MCV 68.7 (L) 80.0 - 100.0 fL   MCH 21.5 (L) 26.0 - 34.0 pg   MCHC 31.3 30.0 - 36.0 g/dL   RDW 84.0 (H) 88.4 - 84.4 %   Platelets 261 150 - 400 K/uL   nRBC 0.0 0.0 - 0.2 %   US  OB Comp Less 14 Wks Result Date: 03/02/2024 EXAM: OBSTETRIC ULTRASOUND FIRST TRIMESTER TECHNIQUE: Transvaginal first trimester obstetric pelvic duplex ultrasound was performed with real-time imaging, color flow Doppler imaging, and spectral analysis. COMPARISON: US  OB Complete 07/26/2013. CLINICAL HISTORY: Back pain, vaginal bleeding. FINDINGS: UTERUS: No focal myometrial mass. GESTATIONAL SAC(S): Single normal appearing intrauterine gestational sac. No subchorionic hemorrhage. YOLK SAC: Present EMBRYO(<11WK) /FETUS(>=11WK): Single CROWN RUMP LENGTH: 12.6 mm. RATE OF CARDIAC ACTIVITY: 144 bpm. RIGHT OVARY: Measures 2.9 x 2.3 x 2.7 cm. LEFT OVARY: Measures 2.6 x 2.3 x 3.7 cm. FREE FLUID: No free fluid. MEASUREMENTS ESTIMATED GESTATIONAL AGE BY CURRENT ULTRASOUND: 7 weeks 4 days ESTIMATED GESTATIONAL AGE BY LMP/PRIOR ULTRASOUND: Not provided. ESTIMATED DUE DATE: 10/13/2024 IMPRESSION: 1. Intrauterine pregnancy with embryonic/fetal cardiac activity at 144 bpm. 2. Estimated gestational age by crown rump length is 7 weeks 4 day. Electronically signed by: Lynwood Seip MD 03/02/2024 12:38 PM EST RP Workstation: [id number]    MDM - GC pending upon discharge.  - HCG > 250000 - UA reflexed to culture to rule out infection  - Wet prep normal  - White count normal, low suspicion for infection.  - US  results show a single living IUP.  - plan for discharge.   Assessment and Plan   1. Vaginal bleeding   2. Spotting   3. Abdominal pain,  unspecified abdominal location   4. [redacted] weeks gestation of pregnancy    - Reviewed reasons for vaginal bleeding ion pregnancy and reviewed worsening signs and return precautions.  - Recommended PO tylenol  for pain relief.  - Patient discharged home in stable condition and may return to MAU as needed.   Claris CHRISTELLA Cedar, MSN CNM  03/02/2024, 11:44 AM

## 2024-03-02 NOTE — MAU Note (Signed)
 Ashlee Romero is a 33 y.o. at Unknown here in MAU reporting: spotting that started today. Having mild cramping for about a week ( cramping not nearly as bad as when she has her periods. SABRA   LMP: 01/09/2024 Onset of complaint: today Pain score: 1-2  Vitals:   03/02/24 0930  BP: 115/82  Pulse: 92  Resp: 18  Temp: 98.9 F (37.2 C)     FHT: n/a   Lab orders placed from triage: upt

## 2024-03-03 LAB — CULTURE, OB URINE: Culture: <10000 — AB

## 2024-03-05 LAB — GC/CHLAMYDIA PROBE AMP (~~LOC~~) NOT AT ARMC
Chlamydia: NEGATIVE
Comment: NEGATIVE
Comment: NORMAL
Neisseria Gonorrhea: NEGATIVE
